# Patient Record
Sex: Female | Born: 2000 | Race: White | Hispanic: No | Smoking: Former smoker
Health system: Southern US, Community
[De-identification: ages and names within clinical notes are randomized; demographics above are authoritative.]

## PROBLEM LIST (undated history)

## (undated) ENCOUNTER — Ambulatory Visit (HOSPITAL_COMMUNITY): Admission: EM | Payer: BC Managed Care – PPO | Source: Home / Self Care

## (undated) DIAGNOSIS — M1990 Unspecified osteoarthritis, unspecified site: Secondary | ICD-10-CM

## (undated) DIAGNOSIS — N83209 Unspecified ovarian cyst, unspecified side: Secondary | ICD-10-CM

## (undated) DIAGNOSIS — N946 Dysmenorrhea, unspecified: Secondary | ICD-10-CM

## (undated) DIAGNOSIS — F32A Depression, unspecified: Secondary | ICD-10-CM

## (undated) DIAGNOSIS — L309 Dermatitis, unspecified: Secondary | ICD-10-CM

## (undated) DIAGNOSIS — N920 Excessive and frequent menstruation with regular cycle: Secondary | ICD-10-CM

## (undated) DIAGNOSIS — F419 Anxiety disorder, unspecified: Secondary | ICD-10-CM

## (undated) HISTORY — DX: Depression, unspecified: F32.A

## (undated) HISTORY — DX: Dysmenorrhea, unspecified: N94.6

## (undated) HISTORY — DX: Unspecified ovarian cyst, unspecified side: N83.209

## (undated) HISTORY — PX: TONSILLECTOMY AND ADENOIDECTOMY: SHX28

## (undated) HISTORY — DX: Dermatitis, unspecified: L30.9

## (undated) HISTORY — DX: Anxiety disorder, unspecified: F41.9

## (undated) HISTORY — DX: Excessive and frequent menstruation with regular cycle: N92.0

## (undated) HISTORY — DX: Unspecified osteoarthritis, unspecified site: M19.90

---
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
## 2019-01-05 NOTE — Telephone Encounter (Signed)
Attempted to contact patient, line is busy.  Will try again later.  Called emergency contact and spoke with [name] (Patients Mother).  She will relate message to call us back.

## 2019-01-05 NOTE — Telephone Encounter (Signed)
Okay thank you, Please let me know if I need to do anything on my end.

## 2019-01-06 ENCOUNTER — Telehealth: Payer: Self-pay | Admitting: Certified Nurse Midwife

## 2019-01-06 NOTE — Telephone Encounter (Signed)
Patient called stating she got a new cell phone and number. The new number is correct in her chart. Patient advised the nurse has 24 hours to return the call. Thanks

## 2019-01-07 NOTE — Telephone Encounter (Signed)
LMTRC

## 2019-01-07 NOTE — Telephone Encounter (Signed)
Patient returned call.  Patient c/o thin clear vaginal discharge x2 months, no pain or odor.  Requesting Rx be sent in, I advised patient she needs an OV for further evaluation, last OV >2 years ago and patient verbalized understanding.  Patient will check work schedule and call back to schedule OV.

## 2019-01-19 ENCOUNTER — Telehealth: Payer: Self-pay | Admitting: Certified Nurse Midwife

## 2019-01-19 NOTE — Telephone Encounter (Signed)
The patient called and stated that she has vaginal discharge, irritation, and itching. Pt thinks she may have a yeast infection or UTI and is hoping to speak with a nurse and have something called into her pharmacy due to the patient being uncomfortable. Pt scheduled an apt for this Thursday but is hoping to have/get relief before that date. Please advise.

## 2019-01-20 NOTE — Telephone Encounter (Signed)
LMTRC

## 2019-01-21 ENCOUNTER — Ambulatory Visit (INDEPENDENT_AMBULATORY_CARE_PROVIDER_SITE_OTHER): Payer: BC Managed Care – PPO | Admitting: Certified Nurse Midwife

## 2019-01-21 ENCOUNTER — Encounter: Payer: Self-pay | Admitting: Certified Nurse Midwife

## 2019-01-21 ENCOUNTER — Other Ambulatory Visit (HOSPITAL_COMMUNITY)
Admission: RE | Admit: 2019-01-21 | Discharge: 2019-01-21 | Disposition: A | Discharge status: Home / Self Care | Payer: BC Managed Care – PPO | Source: Ambulatory Visit | Attending: Certified Nurse Midwife | Admitting: Certified Nurse Midwife

## 2019-01-21 ENCOUNTER — Other Ambulatory Visit: Payer: Self-pay

## 2019-01-21 VITALS — BP 110/79 | HR 91 | Ht 68.0 in | Wt 238.1 lb

## 2019-01-21 DIAGNOSIS — Z975 Presence of (intrauterine) contraceptive device: Secondary | ICD-10-CM | POA: Diagnosis not present

## 2019-01-21 DIAGNOSIS — N898 Other specified noninflammatory disorders of vagina: Secondary | ICD-10-CM | POA: Diagnosis not present

## 2019-01-21 DIAGNOSIS — L732 Hidradenitis suppurativa: Secondary | ICD-10-CM | POA: Diagnosis not present

## 2019-01-21 MED ORDER — CLINDAMYCIN PHOSPHATE 1 % EX LOTN: TOPICAL_LOTION | Freq: Two times a day (BID) | CUTANEOUS | 0 refills | Status: DC

## 2019-01-21 NOTE — Telephone Encounter (Signed)
Patient had OV today.  Chart will be filed.

## 2019-01-21 NOTE — Progress Notes (Signed)
GYN ENCOUNTER NOTE  Subjective:       Maureen Gutierrez is a 18 y.o. G0P0000 female is here for gynecologic evaluation of the following issues:  1. Vaginal discharge-thick yellowish/whiteish  2. Vaginal irritation 3. Vaginal itching  Reports symptoms for the last month. Wearing pad daily. Minimal relief with home treatment measures.   Denies difficulty breathing or respiratory distress, chest pain, abdominal pain, excessive vaginal bleeding, dysuria, and leg pain or swelling.    Gynecologic History  No LMP recorded. (Menstrual status: IUD).  Contraception: IUD, Mirena placed 09/2016  Last Pap: N/A  Obstetric History  OB History  Gravida Para Term Preterm AB Living  0 0 0 0 0 0  SAB TAB Ectopic Multiple Live Births  0 0 0 0 0    Past Medical History:  Diagnosis Date  . Eczema   . Heavy periods   . Ovarian cyst    left - Dani Gobble (Midwife)  . Painful menstrual periods     Past Surgical History:  Procedure Laterality Date  . TONSILLECTOMY AND ADENOIDECTOMY      Current Outpatient Medications on File Prior to Visit  Medication Sig Dispense Refill  . levonorgestrel (MIRENA) 20 MCG/24HR IUD 1 each by Intrauterine route once.     No current facility-administered medications on file prior to visit.     No Known Allergies  Social History   Socioeconomic History  . Marital status: Single    Spouse name: Not on file  . Number of children: 0  . Years of education: Not on file  . Highest education level: High school graduate  Occupational History  . Occupation: Programme researcher, broadcasting/film/video    Comment: [employer]  . Occupation: Scientist, water quality    Comment: [employer]  . Occupation: Hosts    Comment: [employer]  . Financial resource strain: Not hard at all  . Food insecurity    Worry: Never true    Inability: Never true  . Transportation needs    Medical: No    Non-medical: No  Tobacco Use  . Smoking status: Current Every Day Smoker    Packs/day: 0.50     Types: Cigarettes  . Smokeless tobacco: Never Used  Substance and Sexual Activity  . Alcohol use: Yes    Comment: rare  . Drug use: No  . Sexual activity: Yes    Partners: Male    Birth control/protection: I.U.D.  Lifestyle  . Physical activity    Days per week: 0 days    Minutes per session: 0 min  . Stress: Only a little  Relationships  . Social connections    Talks on phone: More than three times a week    Gets together: More than three times a week    Attends religious service: More than 4 times per year    Active member of club or organization: Yes    Attends meetings of clubs or organizations: More than 4 times per year    Relationship status: Never married  . Intimate partner violence    Fear of current or ex partner: No    Emotionally abused: No    Physically abused: No    Forced sexual activity: No  Other Topics Concern  . Not on file  Social History Narrative   Works 6 days a week at [employer] to [employer] and going to [employer] to get a 2 year college degree    Family History  Problem Relation Age of Onset  .  Rheum arthritis Maternal Grandmother   . Obesity Mother   . Hypertension Father   . ADD / ADHD Brother   . Breast cancer Neg Hx   . Ovarian cancer Neg Hx   . Colon cancer Neg Hx     The following portions of the patient's history were reviewed and updated as appropriate: allergies, current medications, past family history, past medical history, past social history, past surgical history and problem list.  Review of Systems  ROS negative except as noted above. Information obtained from patient.   Objective:   BP 110/79   Pulse 91   Ht 5\' 8"  (1.727 m)   Wt 238 lb 1.6 oz (108 kg)   BMI 36.20 kg/m    CONSTITUTIONAL: Well-developed, well-nourished female in no acute distress.   ABDOMEN: Soft, non distended; Non tender.  No Organomegaly.  PELVIC:  External Genitalia: Normal except for presence of hidradenitis suppurativa  Vagina: Swab  collected   MUSCULOSKELETAL: Normal range of motion. No tenderness.  No cyanosis, clubbing, or edema.  Assessment:   1. Vaginal discharge  - Cervicovaginal ancillary only  2. Vaginal irritation  - Cervicovaginal ancillary only  3. Vaginal itching  - Cervicovaginal ancillary only  4. IUD (intrauterine device) in place  - Cervicovaginal ancillary only   Plan:   Vaginal swab collected, will contact patient with results. Unable to tolerate speculum exam today.   Discussed home vaginal health techniques, see AVS  Rx: Cleocin lotion, see orders.   Reviewed red flag symptoms and when to call.   RTC x 10 months for ANNUAL EXAM or sooner if needed.    , CNM [facility], [facility] 01/21/19 10:02 AM

## 2019-01-21 NOTE — Patient Instructions (Addendum)
Levonorgestrel intrauterine device (IUD) What is this medicine? LEVONORGESTREL IUD (LEE voe nor jes trel) is a contraceptive (birth control) device. The device is placed inside the uterus by a healthcare professional. It is used to prevent pregnancy. This device can also be used to treat heavy bleeding that occurs during your period. This medicine may be used for other purposes; ask your health care provider or pharmacist if you have questions. COMMON BRAND NAME(S): Kyleena, LILETTA, Mirena, Skyla What should I tell my health care provider before I take this medicine? They need to know if you have any of these conditions:  abnormal Pap smear  cancer of the breast, uterus, or cervix  diabetes  endometritis  genital or pelvic infection now or in the past  have more than one sexual partner or your partner has more than one partner  heart disease  history of an ectopic or tubal pregnancy  immune system problems  IUD in place  liver disease or tumor  problems with blood clots or take blood-thinners  seizures  use intravenous drugs  uterus of unusual shape  vaginal bleeding that has not been explained  an unusual or allergic reaction to levonorgestrel, other hormones, silicone, or polyethylene, medicines, foods, dyes, or preservatives  pregnant or trying to get pregnant  breast-feeding How should I use this medicine? This device is placed inside the uterus by a health care professional. Talk to your pediatrician regarding the use of this medicine in children. Special care may be needed. Overdosage: If you think you have taken too much of this medicine contact a poison control center or emergency room at once. NOTE: This medicine is only for you. Do not share this medicine with others. What if I miss a dose? This does not apply. Depending on the brand of device you have inserted, the device will need to be replaced every 3 to 6 years if you wish to continue using this type  of birth control. What may interact with this medicine? Do not take this medicine with any of the following medications:  amprenavir  bosentan  fosamprenavir This medicine may also interact with the following medications:  aprepitant  armodafinil  barbiturate medicines for inducing sleep or treating seizures  bexarotene  boceprevir  griseofulvin  medicines to treat seizures like carbamazepine, ethotoin, felbamate, oxcarbazepine, phenytoin, topiramate  modafinil  pioglitazone  rifabutin  rifampin  rifapentine  some medicines to treat HIV infection like atazanavir, efavirenz, indinavir, lopinavir, nelfinavir, tipranavir, ritonavir  St. John's wort  warfarin This list may not describe all possible interactions. Give your health care provider a list of all the medicines, herbs, non-prescription drugs, or dietary supplements you use. Also tell them if you smoke, drink alcohol, or use illegal drugs. Some items may interact with your medicine. What should I watch for while using this medicine? Visit your doctor or health care professional for regular check ups. See your doctor if you or your partner has sexual contact with others, becomes HIV positive, or gets a sexual transmitted disease. This product does not protect you against HIV infection (AIDS) or other sexually transmitted diseases. You can check the placement of the IUD yourself by reaching up to the top of your vagina with clean fingers to feel the threads. Do not pull on the threads. It is a good habit to check placement after each menstrual period. Call your doctor right away if you feel more of the IUD than just the threads or if you cannot feel the threads at   all. The IUD may come out by itself. You may become pregnant if the device comes out. If you notice that the IUD has come out use a backup birth control method like condoms and call your health care provider. Using tampons will not change the position of the  IUD and are okay to use during your period. This IUD can be safely scanned with magnetic resonance imaging (MRI) only under specific conditions. Before you have an MRI, tell your healthcare provider that you have an IUD in place, and which type of IUD you have in place. What side effects may I notice from receiving this medicine? Side effects that you should report to your doctor or health care professional as soon as possible:  allergic reactions like skin rash, itching or hives, swelling of the face, lips, or tongue  fever, flu-like symptoms  genital sores  high blood pressure  no menstrual period for 6 weeks during use  pain, swelling, warmth in the leg  pelvic pain or tenderness  severe or sudden headache  signs of pregnancy  stomach cramping  sudden shortness of breath  trouble with balance, talking, or walking  unusual vaginal bleeding, discharge  yellowing of the eyes or skin Side effects that usually do not require medical attention (report to your doctor or health care professional if they continue or are bothersome):  acne  breast pain  change in sex drive or performance  changes in weight  cramping, dizziness, or faintness while the device is being inserted  headache  irregular menstrual bleeding within first 3 to 6 months of use  nausea This list may not describe all possible side effects. Call your doctor for medical advice about side effects. You may report side effects to FDA at 1-800-FDA-1088. Where should I keep my medicine? This does not apply. NOTE: This sheet is a summary. It may not cover all possible information. If you have questions about this medicine, talk to your doctor, pharmacist, or health care provider.  2020 Elsevier/Gold Standard (2018-01-27 13:22:01) Preventive Care 36-60 Years Old, Female Preventive care refers to lifestyle choices and visits with your health care provider that can promote health and wellness. At this stage in  your life, you may start seeing a primary care physician instead of a pediatrician. Your health care is now your responsibility. Preventive care for young adults includes:  A yearly physical exam. This is also called an annual wellness visit.  Regular dental and eye exams.  Immunizations.  Screening for certain conditions.  Healthy lifestyle choices, such as diet and exercise. What can I expect for my preventive care visit? Physical exam Your health care provider may check:  Height and weight. These may be used to calculate body mass index (BMI), which is a measurement that tells if you are at a healthy weight.  Heart rate and blood pressure.  Body temperature. Counseling Your health care provider may ask you questions about:  Past medical problems and family medical history.  Alcohol, tobacco, and drug use.  Home and relationship well-being.  Access to firearms.  Emotional well-being.  Diet, exercise, and sleep habits.  Sexual activity and sexual health.  Method of birth control.  Menstrual cycle.  Pregnancy history. What immunizations do I need?  Influenza (flu) vaccine  This is recommended every year. Tetanus, diphtheria, and pertussis (Tdap) vaccine  You may need a Td booster every 10 years. Varicella (chickenpox) vaccine  You may need this vaccine if you have not already been vaccinated. Human  papillomavirus (HPV) vaccine  If recommended by your health care provider, you may need three doses over 6 months. Measles, mumps, and rubella (MMR) vaccine  You may need at least one dose of MMR. You may also need a second dose. Meningococcal conjugate (MenACWY) vaccine  One dose is recommended if you are 20-53 years old and a Market researcher living in a residence hall, or if you have one of several medical conditions. You may also need additional booster doses. Pneumococcal conjugate (PCV13) vaccine  You may need this if you have certain  conditions and were not previously vaccinated. Pneumococcal polysaccharide (PPSV23) vaccine  You may need one or two doses if you smoke cigarettes or if you have certain conditions. Hepatitis A vaccine  You may need this if you have certain conditions or if you travel or work in places where you may be exposed to hepatitis A. Hepatitis B vaccine  You may need this if you have certain conditions or if you travel or work in places where you may be exposed to hepatitis B. Haemophilus influenzae type b (Hib) vaccine  You may need this if you have certain risk factors. You may receive vaccines as individual doses or as more than one vaccine together in one shot (combination vaccines). Talk with your health care provider about the risks and benefits of combination vaccines. What tests do I need? Blood tests  Lipid and cholesterol levels. These may be checked every 5 years starting at age 88.  Hepatitis C test.  Hepatitis B test. Screening  Pelvic exam and Pap test. This may be done every 3 years starting at age 68.  Sexually transmitted disease (STD) testing, if you are at risk.  BRCA-related cancer screening. This may be done if you have a family history of breast, ovarian, tubal, or peritoneal cancers. Other tests  Tuberculosis skin test.  Vision and hearing tests.  Skin exam.  Breast exam. Follow these instructions at home: Eating and drinking   Eat a diet that includes fresh fruits and vegetables, whole grains, lean protein, and low-fat dairy products.  Drink enough fluid to keep your urine pale yellow.  Do not drink alcohol if: ? Your health care provider tells you not to drink. ? You are pregnant, may be pregnant, or are planning to become pregnant. ? You are under the legal drinking age. In the [place] the legal drinking age is 24.  If you drink alcohol: ? Limit how much you have to 0-1 drink a day. ? Be aware of how much alcohol is in your drink. In the [place] one  drink equals one 12 oz bottle of beer (355 mL), one 5 oz glass of wine (148 mL), or one 1 oz glass of hard liquor (44 mL). Lifestyle  Take daily care of your teeth and gums.  Stay active. Exercise at least 30 minutes 5 or more days of the week.  Do not use any products that contain nicotine or tobacco, such as cigarettes, e-cigarettes, and chewing tobacco. If you need help quitting, ask your health care provider.  Do not use drugs.  If you are sexually active, practice safe sex. Use a condom or other form of birth control (contraception) in order to prevent pregnancy and STIs (sexually transmitted infections). If you plan to become pregnant, see your health care provider for a pre-conception visit.  Find healthy ways to cope with stress, such as: ? Meditation, yoga, or listening to music. ? Journaling. ? Talking to a trusted  person. ? Spending time with friends and family. Safety  Always wear your seat belt while driving or riding in a vehicle.  Do not drive if you have been drinking alcohol. Do not ride with someone who has been drinking.  Do not drive when you are tired or distracted. Do not text while driving.  Wear a helmet and other protective equipment during sports activities.  If you have firearms in your house, make sure you follow all gun safety procedures.  Seek help if you have been bullied, physically abused, or sexually abused.  Use the Internet responsibly to avoid dangers such as online bullying and online sex predators. What's next?  Go to your health care provider once a year for a well check visit.  Ask your health care provider how often you should have your eyes and teeth checked.  Stay up to date on all vaccines. This information is not intended to replace advice given to you by your health care provider. Make sure you discuss any questions you have with your health care provider. Document Released: 08/03/2015 Document Revised: 03/12/2018 Document  Reviewed: 03/12/2018 Elsevier Patient Education  [place].  Vaginitis Vaginitis is a condition in which the vaginal tissue swells and becomes red (inflamed). This condition is most often caused by a change in the normal balance of bacteria and yeast that live in the vagina. This change causes an overgrowth of certain bacteria or yeast, which causes the inflammation. There are different types of vaginitis, but the most common types are:  Bacterial vaginosis.  Yeast infection (candidiasis).  Trichomoniasis vaginitis. This is a sexually transmitted disease (STD).  Viral vaginitis.  Atrophic vaginitis.  Allergic vaginitis. What are the causes? The cause of this condition depends on the type of vaginitis. It can be caused by:  Bacteria (bacterial vaginosis).  Yeast, which is a fungus (yeast infection).  A parasite (trichomoniasis vaginitis).  A virus (viral vaginitis).  Low hormone levels (atrophic vaginitis). Low hormone levels can occur during pregnancy, breastfeeding, or after menopause.  Irritants, such as bubble baths, scented tampons, and feminine sprays (allergic vaginitis). Other factors can change the normal balance of the yeast and bacteria that live in the vagina. These include:  Antibiotic medicines.  Poor hygiene.  Diaphragms, vaginal sponges, spermicides, birth control pills, and intrauterine devices (IUD).  Sex.  Infection.  Uncontrolled diabetes.  A weakened defense (immune) system. What increases the risk? This condition is more likely to develop in women who:  Smoke.  Use vaginal douches, scented tampons, or scented sanitary pads.  Wear tight-fitting pants.  Wear thong underwear.  Use oral birth control pills or an IUD.  Have sex without a condom.  Have multiple sex partners.  Have an STD.  Frequently use the spermicide nonoxynol-9.  Eat lots of foods high in sugar.  Have uncontrolled diabetes.  Have low estrogen  levels.  Have a weakened immune system from an immune disorder or medical treatment.  Are pregnant or breastfeeding. What are the signs or symptoms? Symptoms vary depending on the cause of the vaginitis. Common symptoms include:  Abnormal vaginal discharge. ? The discharge is white, gray, or yellow with bacterial vaginosis. ? The discharge is thick, white, and cheesy with a yeast infection. ? The discharge is frothy and yellow or greenish with trichomoniasis.  A bad vaginal smell. The smell is fishy with bacterial vaginosis.  Vaginal itching, pain, or swelling.  Sex that is painful.  Pain or burning when urinating. Sometimes there are no  symptoms. How is this diagnosed? This condition is diagnosed based on your symptoms and medical history. A physical exam, including a pelvic exam, will also be done. You may also have other tests, including:  Tests to determine the pH level (acidity or alkalinity) of your vagina.  A whiff test, to assess the odor that results when a sample of your vaginal discharge is mixed with a potassium hydroxide solution.  Tests of vaginal fluid. A sample will be examined under a microscope. How is this treated? Treatment varies depending on the type of vaginitis you have. Your treatment may include:  Antibiotic creams or pills to treat bacterial vaginosis and trichomoniasis.  Antifungal medicines, such as vaginal creams or suppositories, to treat a yeast infection.  Medicine to ease discomfort if you have viral vaginitis. Your sexual partner should also be treated.  Estrogen delivered in a cream, pill, suppository, or vaginal ring to treat atrophic vaginitis. If vaginal dryness occurs, lubricants and moisturizing creams may help. You may need to avoid scented soaps, sprays, or douches.  Stopping use of a product that is causing allergic vaginitis. Then using a vaginal cream to treat the symptoms. Follow these instructions at home: Lifestyle  Keep  your genital area clean and dry. Avoid soap, and only rinse the area with water.  Do not douche or use tampons until your health care provider says it is okay to do so. Use sanitary pads, if needed.  Do not have sex until your health care provider approves. When you can return to sex, practice safe sex and use condoms.  Wipe from front to back. This avoids the spread of bacteria from the rectum to the vagina. General instructions  Take over-the-counter and prescription medicines only as told by your health care provider.  If you were prescribed an antibiotic medicine, take or use it as told by your health care provider. Do not stop taking or using the antibiotic even if you start to feel better.  Keep all follow-up visits as told by your health care provider. This is important. How is this prevented?  Use mild, non-scented products. Do not use things that can irritate the vagina, such as fabric softeners. Avoid the following products if they are scented: ? Feminine sprays. ? Detergents. ? Tampons. ? Feminine hygiene products. ? Soaps or bubble baths.  Let air reach your genital area. ? Wear cotton underwear to reduce moisture buildup. ? Avoid wearing underwear while you sleep. ? Avoid wearing tight pants and underwear or nylons without a cotton panel. ? Avoid wearing thong underwear.  Take off any wet clothing, such as bathing suits, as soon as possible.  Practice safe sex and use condoms. Contact a health care provider if:  You have abdominal pain.  You have a fever.  You have symptoms that last for more than 2-3 days. Get help right away if:  You have a fever and your symptoms suddenly get worse. Summary  Vaginitis is a condition in which the vaginal tissue becomes inflamed.This condition is most often caused by a change in the normal balance of bacteria and yeast that live in the vagina.  Treatment varies depending on the type of vaginitis you have.  Do not douche,  use tampons , or have sex until your health care provider approves. When you can return to sex, practice safe sex and use condoms. This information is not intended to replace advice given to you by your health care provider. Make sure you discuss any questions you  have with your health care provider. Document Released: 01/13/2007 Document Revised: 02/28/2017 Document Reviewed: 04/23/2016 Elsevier Patient Education  [place].  Clindamycin Phosphate Topical Lotion What is this medicine? CLINDAMYCIN ([place] sin) is a lincosamide antibiotic. It is used on the skin to stop the growth of certain bacteria that cause acne. This medicine may be used for other purposes; ask your health care provider or pharmacist if you have questions. COMMON BRAND NAME(S): Cleocin T, ClindaMax What should I tell my health care provider before I take this medicine? They need to know if you have any of these conditions:  diarrhea  inflammatory bowel disease  kidney or liver disease  stomach problems like colitis  an unusual or allergic reaction to clindamycin, lincomycin, other medicines, foods, dyes or preservatives  pregnant or trying to get pregnant  breast-feeding How should I use this medicine? This medicine is for external use only. Wash hands before and after use. Wash affected area and gently pat dry. Shake well before use. Apply a thin layer of this medicine to the affected area as often as prescribed by your doctor or health care professional. Do not use this medicine near the eyes, nose, or mouth. If you do get any in your eyes rinse out with plenty of cool tap water. Take all of your medicine as directed even if you think your are better. Do not skip doses or stop your medicine early. Talk to your pediatrician regarding the use of this medicine in children. Special care may be needed. While this medicine may be prescribed for children as young as 12 years for selected conditions,  precautions do apply. Overdosage: If you think you have taken too much of this medicine contact a poison control center or emergency room at once. NOTE: This medicine is only for you. Do not share this medicine with others. What if I miss a dose? If you miss a dose, use it as soon as you can. If it is almost time for your next dose, use only that dose. Do not use double or extra doses. What may interact with this medicine?  medicated cosmetics, including cover up preparations  other acne products including benzoyl peroxide, salicylic acid, or tretinoin  skin care products that have a high alcohol content (some shaving creams, lotions, or after-shave lotion)  some skin cleansers or medicated soaps This list may not describe all possible interactions. Give your health care provider a list of all the medicines, herbs, non-prescription drugs, or dietary supplements you use. Also tell them if you smoke, drink alcohol, or use illegal drugs. Some items may interact with your medicine. What should I watch for while using this medicine? Your acne should start to get better within about 6 weeks. Complete improvement may take longer. Tell your doctor or health care professional if you do not see any improvement. Your skin may get very dry and scale or peel. Let your doctor or health care professional know if this happens. Do not use any soothing cream or ointment without advice. What side effects may I notice from receiving this medicine? Side effects that you should report to your doctor or health care professional as soon as possible:  allergic reactions like skin rash, itching or hives, swelling of the face, lips, or tongue  diarrhea that is watery or severe  pain on swallowing  stomach pain or cramps  unusual bleeding or bruising Side effects that usually do not require medical attention (report to your doctor or  health care professional if they continue or are bothersome):  dry  skin  nausea, vomiting This list may not describe all possible side effects. Call your doctor for medical advice about side effects. You may report side effects to FDA at 1-800-FDA-1088. Where should I keep my medicine? Keep out of the reach of children. Store at controlled room temperature between 20 and 25 degrees C (68 and 77 degrees F). Do not freeze. This medicine is flammable. Avoid exposure to heat, fire, flame, and smoking. Throw away any unused medicine after the expiration date. NOTE: This sheet is a summary. It may not cover all possible information. If you have questions about this medicine, talk to your doctor, pharmacist, or health care provider.  2020 Elsevier/Gold Standard (2012-10-22 16:16:30)    Hidradenitis Suppurativa Hidradenitis suppurativa is a long-term (chronic) skin disease. It is similar to a severe form of acne, but it affects areas of the body where acne would be unusual, especially areas of the body where skin rubs against skin and becomes moist. These include:  Underarms.  Groin.  Genital area.  Buttocks.  Upper thighs.  Breasts. Hidradenitis suppurativa may start out as small lumps or pimples caused by blocked sweat glands or hair follicles. Pimples may develop into deep sores that break open (rupture) and drain pus. Over time, affected areas of skin may thicken and become scarred. This condition is rare and does not spread from person to person (non-contagious). What are the causes? The exact cause of this condition is not known. It may be related to:  Female and female hormones.  An overactive disease-fighting system (immune system). The immune system may over-react to blocked hair follicles or sweat glands and cause swelling and pus-filled sores. What increases the risk? You are more likely to develop this condition if you:  Are female.  Are 7-79 years old.  Have a family history of hidradenitis suppurativa.  Have a personal history of  acne.  Are overweight.  Smoke.  Take the medicine lithium. What are the signs or symptoms? The first symptoms are usually painful bumps in the skin, similar to pimples. The condition may get worse over time (progress), or it may only cause mild symptoms. If the disease progresses, symptoms may include:  Skin bumps getting bigger and growing deeper into the skin.  Bumps rupturing and draining pus.  Itchy, infected skin.  Skin getting thicker and scarred.  Tunnels under the skin (fistulas) where pus drains from a bump.  Pain during daily activities, such as pain during walking if your groin area is affected.  Emotional problems, such as stress or depression. This condition may affect your appearance and your ability or willingness to wear certain clothes or do certain activities. How is this diagnosed? This condition is diagnosed by a health care provider who specializes in skin diseases (dermatologist). You may be diagnosed based on:  Your symptoms and medical history.  A physical exam.  Testing a pus sample for infection.  Blood tests. How is this treated? Your treatment will depend on how severe your symptoms are. The same treatment will not work for everybody with this condition. You may need to try several treatments to find what works best for you. Treatment may include:  Cleaning and bandaging (dressing) your wounds as needed.  Lifestyle changes, such as new skin care routines.  Taking medicines, such as: ? Antibiotics. ? Acne medicines. ? Medicines to reduce the activity of the immune system. ? A diabetes medicine (metformin). ? Birth  control pills, for women. ? Steroids to reduce swelling and pain.  Working with a mental health care provider, if you experience emotional distress due to this condition. If you have severe symptoms that do not get better with medicine, you may need surgery. Surgery may involve:  Using a laser to clear the skin and remove hair  follicles.  Opening and draining deep sores.  Removing the areas of skin that are diseased and scarred. Follow these instructions at home: Medicines   Take over-the-counter and prescription medicines only as told by your health care provider.  If you were prescribed an antibiotic medicine, take it as told by your health care provider. Do not stop taking the antibiotic even if your condition improves. Skin care  If you have open wounds, cover them with a clean dressing as told by your health care provider. Keep wounds clean by washing them gently with soap and water when you bathe.  Do not shave the areas where you get hidradenitis suppurativa.  Do not wear deodorant.  Wear loose-fitting clothes.  Try to avoid getting overheated or sweaty. If you get sweaty or wet, change into clean, dry clothes as soon as you can.  To help relieve pain and itchiness, cover sore areas with a warm, clean washcloth (warm compress) for 5-10 minutes as often as needed.  If told by your health care provider, take a bleach bath twice a week: ? Fill your bathtub halfway with water. ? Pour in  cup of unscented household bleach. ? Soak in the tub for 5-10 minutes. ? Only soak from the neck down. Avoid water on your face and hair. ? Shower to rinse off the bleach from your skin. General instructions  Learn as much as you can about your disease so that you have an active role in your treatment. Work closely with your health care provider to find treatments that work for you.  If you are overweight, work with your health care provider to lose weight as recommended.  Do not use any products that contain nicotine or tobacco, such as cigarettes and e-cigarettes. If you need help quitting, ask your health care provider.  If you struggle with living with this condition, talk with your health care provider or work with a mental health care provider as recommended.  Keep all follow-up visits as told by your  health care provider. This is important. Where to find more information  Hidradenitis [place].: [email or web address]://www.hs-foundation.org/ Contact a health care provider if you have:  A flare-up of hidradenitis suppurativa.  A fever or chills.  Trouble controlling your symptoms at home.  Trouble doing your daily activities because of your symptoms.  Trouble dealing with emotional problems related to your condition. Summary  Hidradenitis suppurativa is a long-term (chronic) skin disease. It is similar to a severe form of acne, but it affects areas of the body where acne would be unusual.  The first symptoms are usually painful bumps in the skin, similar to pimples. The condition may get worse over time (progress), or it may only cause mild symptoms.  If you have open wounds, cover them with a clean dressing as told by your health care provider. Keep wounds clean by washing them gently with soap and water when you bathe.  Besides skin care, treatment may include medicines, laser treatment, and surgery. This information is not intended to replace advice given to you by your health care provider. Make sure you discuss any questions you have with your health  care provider. Document Released: 10/31/2003 Document Revised: 03/26/2017 Document Reviewed: 03/26/2017 Elsevier Patient Education  2020 [employer].

## 2019-01-21 NOTE — Progress Notes (Signed)
Patient c/o thick yellowish/whiteish vaginal discharge, vaginal irritation and vaginal itching x1 month.  Patient having to wear pad.

## 2019-01-26 DIAGNOSIS — Z20828 Contact with and (suspected) exposure to other viral communicable diseases: Secondary | ICD-10-CM | POA: Diagnosis not present

## 2019-01-27 ENCOUNTER — Ambulatory Visit
Admission: RE | Admit: 2019-01-27 | Discharge: 2019-01-27 | Disposition: A | Discharge status: Home / Self Care | Payer: BC Managed Care – PPO | Source: Ambulatory Visit | Attending: Family Medicine | Admitting: Family Medicine

## 2019-01-27 ENCOUNTER — Other Ambulatory Visit: Payer: Self-pay | Admitting: Family Medicine

## 2019-01-27 ENCOUNTER — Telehealth: Payer: BC Managed Care – PPO | Admitting: Family Medicine

## 2019-01-27 ENCOUNTER — Other Ambulatory Visit: Payer: Self-pay

## 2019-01-27 DIAGNOSIS — R05 Cough: Secondary | ICD-10-CM | POA: Diagnosis not present

## 2019-01-27 DIAGNOSIS — R059 Cough, unspecified: Secondary | ICD-10-CM

## 2019-01-28 ENCOUNTER — Other Ambulatory Visit (INDEPENDENT_AMBULATORY_CARE_PROVIDER_SITE_OTHER): Payer: BC Managed Care – PPO | Admitting: Certified Nurse Midwife

## 2019-01-28 DIAGNOSIS — A599 Trichomoniasis, unspecified: Secondary | ICD-10-CM

## 2019-01-28 DIAGNOSIS — A749 Chlamydial infection, unspecified: Secondary | ICD-10-CM

## 2019-01-28 DIAGNOSIS — A549 Gonococcal infection, unspecified: Secondary | ICD-10-CM

## 2019-01-28 LAB — CERVICOVAGINAL ANCILLARY ONLY
Bacterial Vaginitis (gardnerella): NEGATIVE
Candida Glabrata: NEGATIVE
Candida Vaginitis: NEGATIVE
Chlamydia: POSITIVE — AB
Comment: NEGATIVE
Comment: NEGATIVE
Comment: NEGATIVE
Comment: NEGATIVE
Comment: NEGATIVE
Comment: NORMAL
Neisseria Gonorrhea: POSITIVE — AB
Trichomonas: POSITIVE — AB

## 2019-01-28 MED ORDER — AZITHROMYCIN 500 MG PO TABS: 1000.0000 mg | ORAL_TABLET | Freq: Once | ORAL | 0 refills | Status: AC

## 2019-01-28 MED ORDER — METRONIDAZOLE 500 MG PO TABS: ORAL_TABLET | ORAL | 0 refills | Status: DC

## 2019-01-28 NOTE — Progress Notes (Signed)
Rx: Azithromycin and Flagyl, see orders.    Diona Fanti, CNM [facility] [place] 01/28/19 12:09 PM

## 2019-01-28 NOTE — Progress Notes (Signed)
Please contact pt, vaginal swab positive for GC/CH and Trich. Rx Azithromycin and Flagyl sent. Needs to come to office for Rocephin IM. Schedule TOC in one (1) month. Contact [facility]. Thanks, JML

## 2019-01-29 ENCOUNTER — Ambulatory Visit (INDEPENDENT_AMBULATORY_CARE_PROVIDER_SITE_OTHER): Payer: BC Managed Care – PPO | Admitting: Certified Nurse Midwife

## 2019-01-29 ENCOUNTER — Other Ambulatory Visit: Payer: Self-pay

## 2019-01-29 VITALS — BP 126/86 | HR 112 | Ht 68.0 in | Wt 236.8 lb

## 2019-01-29 DIAGNOSIS — A549 Gonococcal infection, unspecified: Secondary | ICD-10-CM | POA: Diagnosis not present

## 2019-01-29 DIAGNOSIS — A749 Chlamydial infection, unspecified: Secondary | ICD-10-CM

## 2019-01-29 MED ORDER — CEFTRIAXONE SODIUM 250 MG IJ SOLR
250.0000 mg | Freq: Once | INTRAMUSCULAR | Status: AC
  Administered 2019-01-29: 08:00:00 250 mg via INTRAMUSCULAR

## 2019-01-29 NOTE — Patient Instructions (Signed)

## 2019-01-29 NOTE — Progress Notes (Signed)
Patient comes in today for Ceftriaxone 250 mg injection in upper outer left gluten. Patient tolerated well.  She has already taken the azithromycin.

## 2019-02-12 ENCOUNTER — Telehealth: Payer: Self-pay

## 2019-02-12 NOTE — Telephone Encounter (Signed)
Pt would like to speak to someone this morning. Pt states it usually takes a long time for someone to call her back. Pt needs advice.  Pt states took all meds and had the shot and feels fine. Pt also notes she does have IUD in place. Pt concerned of slight blood this morning when wiping in bathroom. No burning or itching,. Call to advise if normal or needs appt.

## 2019-02-12 NOTE — Telephone Encounter (Signed)
Attempted to contact patient, no answer.  LMTRC. 

## 2019-02-15 NOTE — Telephone Encounter (Signed)
Called and spoke with patient.  Patient c/o occasional vaginal spotting that started 3 days ago, not enough to wear a pad.  Offered appointment and patient prefers to just monitor for now.  Patient denies any other complaints, no fever, no pain.  Advised patient if symptoms persist or get worse to let us know and patient verbalized understating.

## 2019-02-15 NOTE — Telephone Encounter (Signed)
Attempted to contact patient, no answer.  LMTRC. 

## 2019-02-15 NOTE — Telephone Encounter (Signed)
Pt lvm stating returning call to [place].

## 2019-02-24 ENCOUNTER — Telehealth: Payer: Self-pay

## 2019-02-24 NOTE — Telephone Encounter (Signed)
Called and spoke with patient.  Patient c/o vaginal spotting and thin vaginal discharge x1 week.  Patient having to wear pad and vaginal area is very sensitive.  Patient states partner was never treated and patient had unprotected intercourse "with him and someone else".  Advised patient to go to urgent care or come in 12/1 for [facility] appointment.  Patient verbalized understanding.  Patient states she will probably go to urgent care.

## 2019-02-24 NOTE — Telephone Encounter (Signed)
Pt called requesting to speak with providers assistant. Pt states she has concerns and questions.

## 2019-03-02 ENCOUNTER — Encounter: Payer: BC Managed Care – PPO | Admitting: Certified Nurse Midwife

## 2019-04-07 DIAGNOSIS — Z20828 Contact with and (suspected) exposure to other viral communicable diseases: Secondary | ICD-10-CM | POA: Diagnosis not present

## 2019-04-26 ENCOUNTER — Telehealth: Payer: Self-pay

## 2019-04-26 NOTE — Telephone Encounter (Signed)
Called and spoke with patient.  Patient c/o constant lower back and "slight pelvic pain" x2 days, also having bright red vaginal bleeding that started yesterday.  Taking Ibuprofen or Tylenol with no relief, using heating pad.  Patient requesting an appointment today, advised patient no available appointments today but can schedule an OV in the near future.  Patient states "I already have an appointment today at 4:15 pm at an Urgent Care on [facility]".  Patient will call back if she needs appointment scheduled for further evaluation.

## 2019-04-26 NOTE — Telephone Encounter (Signed)
Pt is experiencing pain pain and bleeding. Feels that her cyst are growing. Has IUD. Would like to a call from nurse since Marcelino Duster is out.

## 2019-04-27 ENCOUNTER — Ambulatory Visit: Payer: BC Managed Care – PPO | Admitting: Family Medicine

## 2019-06-30 ENCOUNTER — Encounter: Payer: BC Managed Care – PPO | Admitting: Certified Nurse Midwife

## 2019-07-26 ENCOUNTER — Ambulatory Visit (INDEPENDENT_AMBULATORY_CARE_PROVIDER_SITE_OTHER): Payer: BC Managed Care – PPO | Admitting: Internal Medicine

## 2019-07-26 ENCOUNTER — Other Ambulatory Visit: Payer: Self-pay

## 2019-07-26 ENCOUNTER — Encounter: Payer: Self-pay | Admitting: Internal Medicine

## 2019-07-26 VITALS — Ht 68.0 in | Wt 236.0 lb

## 2019-07-26 DIAGNOSIS — F17200 Nicotine dependence, unspecified, uncomplicated: Secondary | ICD-10-CM | POA: Insufficient documentation

## 2019-07-26 DIAGNOSIS — J40 Bronchitis, not specified as acute or chronic: Secondary | ICD-10-CM

## 2019-07-26 MED ORDER — AZITHROMYCIN 250 MG PO TABS: ORAL_TABLET | ORAL | 0 refills | Status: DC

## 2019-07-26 NOTE — Progress Notes (Signed)
Per Dr. Dorris Fetch a letter was written for the patient to be out of work for 3-5 days pending symptoms improve. Patient should follow up in office if symptoms continue or worsen.

## 2019-07-26 NOTE — Progress Notes (Signed)
Name: Maureen Gutierrez   MRN: [id number]    DOB: 29-Dec-2000   Date:07/26/2019       Progress Note  Subjective  Chief Complaint  Chief Complaint  Patient presents with  . Cough    Wheezing, one week ago started to have a "tickle in the back of the throat"  . Chest congestion    tested negative for covid, painful to cough, chest hurts  . Nasal Congestion    took 5 days of prednisone and a multi symptom cold medication    I connected with  Ruta Oliva  on 07/26/19 at 10:00 AM EDT by a video enabled telemedicine application and verified that I am speaking with the correct person using two identifiers.  I discussed the limitations of evaluation and management by telemedicine and the availability of in person appointments. The patient expressed understanding and agreed to proceed. Staff also discussed with the patient that there may be a patient responsible charge related to this service. Patient Location: Home Provider Location: [facility] Additional Individuals present: none  HPI Patient presents today with concerns for bronchitis. She noted a "tickle in the back of her throat "starting about a week and half ago, next day had increased nasal congestion/postnasal drip, also increased cough and wheezing.  She notes it is now more painful to cough and her chest hurts, and has been over the past several days. She has taken 5 days of prednisone - 10 mg, weaned over 6 days, and a multi symptom cold medication, benadryl at night recommended after she saw her mother's doctor.  Today is her last day of the medicine She noted she did have a Covid test which was negative at [facility] doctor and was nasal swab rapid test.  The doctor noted it was likely a viral type of infection not Covid. The cough has continued, + production - yellow mucus , no blood No marked SOB, occas  Low grade fever has been noted, feeling feverish, sweats yesterday no loss of smell, loss of taste questioned No N/V min muscle aches  at best.  No marked body aches + fatigue No marked loose stools/diarrhea CP increased with cough, no confusion, no passing out episodes She works at [employer], and has not gone to work in the last 2-3 days due to her symptoms.  She has been going to work prior. Of note, her grandparents both had Covid in January, and had similar type symptoms with their initial rapid Covid test negative, and then her follow-up testing with the test it took several days to come back was positive.  She notes she may have had the test too early and it may have been a false negative.  Comorbid conditions reviewed No asthma  No h/o DM, heart disease, CKD, positive obesity  Tob -current every day smoker, not even a half a pack/day noted alcohol - denied  Patient Active Problem List   Diagnosis Date Noted  . Hidradenitis suppurativa 01/21/2019  . Keratosis pilaris 02/16/2018  . Menorrhagia with regular cycle 02/16/2018  . IUD contraception   . Left ovarian cyst 01/18/2017    Past Surgical History:  Procedure Laterality Date  . TONSILLECTOMY AND ADENOIDECTOMY      Family History  Problem Relation Age of Onset  . Rheum arthritis Maternal Grandmother   . Obesity Mother   . Hypertension Father   . ADD / ADHD Brother   . Breast cancer Neg Hx   . Ovarian cancer Neg Hx   . Colon  cancer Neg Hx     Social History   Tobacco Use  . Smoking status: Current Every Day Smoker    Packs/day: 0.50    Types: Cigarettes  . Smokeless tobacco: Never Used  Substance Use Topics  . Alcohol use: Yes    Comment: rare     Current Outpatient Medications:  .  levonorgestrel (MIRENA) 20 MCG/24HR IUD, 1 each by Intrauterine route once., Disp: , Rfl:   No Known Allergies  With staff assistance, above reviewed with the patient today.   ROS: As per HPI, otherwise no specific complaints on a limited and focused system review   Objective  Virtual encounter, vitals not obtained.  Body mass index is  35.88 kg/m.  Physical Exam  Patient appears in NAD, pleasant, some obvious nasal congestion evident, conjunctiva not markedly injected, no marked coughing episodes during our video visit Pulmonary/Chest: Effort normal. No respiratory distress. Speaking in complete sentences Neurological: Pt is alert and oriented,  Speech is normal.  Psychiatric: Patient has a normal mood and affect, behavior is normal. Very appropriate with conversation, judgment and thought content normal.   No results found for this or any previous visit (from the past 72 hour(s)).  PHQ2/9: Depression screen [facility] 2/9 07/26/2019 02/16/2018  Decreased Interest 0 0  Down, Depressed, Hopeless 0 0  PHQ - 2 Score 0 0  Altered sleeping 0 0  Tired, decreased energy 2 0  Change in appetite 0 0  Feeling bad or failure about yourself  0 0  Trouble concentrating 0 0  Moving slowly or fidgety/restless 0 0  Suicidal thoughts 0 0  PHQ-9 Score 2 0  Difficult doing work/chores Not difficult at all Not difficult at all   PHQ-2/9 Result reviewed   Fall Risk: Fall Risk  07/26/2019 02/16/2018  Falls in the past year? 0 0  Number falls in past yr: 0 -  Injury with Fall? 0 -     Assessment & Plan  1. Bronchitis Discussed her illness at length, and could still be a Covid infection concern.  Discussed the limitations of the video visit, and the ideal being able to listen to her chest presently.  Her initial test done with her mom's doctor was negative.  (A rapid test).  She has not had loss of taste or smell, no GI symptoms of concern, and no marked body aches.  Temperature elevations remain low-grade by report.  Discussed the potential of a false negative test and is a possibility. Felt best at this point to add an antibiotic with azithromycin added.  Will take to the first day then 1 a day for 4 more days. Also a Mucinex DM product recommended in combination, more as needed. Also can take an ibuprofen or Tylenol type product to  help with any low-grade temps, any increased achiness, with the ibuprofen product preferred if her stomach does not have side effects, and if it does, taking Tylenol in its place. Emphasized increasing fluids and rest, with no return to work presently, and not to until symptoms markedly improved.  Her work does involve being around people, and contagious concerns exist. Did discuss getting another Covid test, and agreed if her symptoms are not significantly better over the next 2 to 3 days, having a Covid test done, with a phone number given for her to call to schedule to have that done.  If the timing does not allow through the common system, and get a test at a local pharmacy again as well.  We will hold on adding another steroid at this point She asked for a work note, and will try to get her along with Melissa's help noting she should not return until her symptoms are significantly improved, and if she does get a Covid test, need to wait for that result prior to returning. Also noted if her symptoms significantly worsen with high fevers, increased shortness of breath, nausea/vomiting, she likely will need to be seen in an urgent care/emergency setting, and she was understanding of that. - azithromycin (ZITHROMAX) 250 MG tablet; Take as directed  Dispense: 6 tablet; Refill: 0  2. Tobacco dependence Strongly encouraged lessening her cigarette use to get to complete cessation, and the importance of that over time.  She will follow-up again if not improving or more problematic   I discussed the assessment and treatment plan with the patient. The patient was provided an opportunity to ask questions and all were answered. The patient agreed with the plan and demonstrated an understanding of the instructions.  Red flags and when to present for emergency care or RTC including higher fevers, increasing chest pain, shortness of breath, new/worsening/un-resolving symptoms reviewed with patient at time of  visit.  The patient was advised to call back or seek an in-person evaluation if the symptoms worsen or if the condition fails to improve as anticipated.  I provided 20 minutes of non-face-to-face time during this encounter that included discussing at length patient's sx/history, pertinent pmhx, medications, treatment and follow up plan. This time also included the necessary documentation, orders, and chart review.

## 2019-11-18 ENCOUNTER — Encounter: Payer: BC Managed Care – PPO | Admitting: Certified Nurse Midwife

## 2019-11-29 ENCOUNTER — Encounter: Payer: Self-pay | Admitting: Certified Nurse Midwife

## 2020-01-15 DIAGNOSIS — Z20822 Contact with and (suspected) exposure to covid-19: Secondary | ICD-10-CM | POA: Diagnosis not present

## 2020-01-19 DIAGNOSIS — Z20822 Contact with and (suspected) exposure to covid-19: Secondary | ICD-10-CM | POA: Diagnosis not present

## 2020-01-27 ENCOUNTER — Telehealth: Payer: Self-pay

## 2020-01-27 NOTE — Telephone Encounter (Signed)
Called mobile number- # disconnected. Called home phone- a woman answered and said pt was not there and hung up.  Called pt in regards to IUD. Patient wanted to know when it was placed and when it is due to be removed.

## 2020-01-27 NOTE — Telephone Encounter (Signed)
Pt called in and stated that she was unsure when she had her IUD nserted and that she is wanting to know when and is it time to take it out. The pt is requesting a call back. Please advise

## 2020-03-16 ENCOUNTER — Encounter: Payer: Self-pay | Admitting: Certified Nurse Midwife

## 2020-04-20 DIAGNOSIS — J069 Acute upper respiratory infection, unspecified: Secondary | ICD-10-CM | POA: Diagnosis not present

## 2020-04-20 DIAGNOSIS — Z20822 Contact with and (suspected) exposure to covid-19: Secondary | ICD-10-CM | POA: Diagnosis not present

## 2020-05-08 ENCOUNTER — Encounter: Payer: Self-pay | Admitting: Certified Nurse Midwife

## 2020-05-29 NOTE — Progress Notes (Deleted)
Name: Maureen Gutierrez   MRN: [id number]    DOB: 10/16/2000   Date:05/29/2020       Progress Note  Subjective  Chief Complaint  Annual Exam  HPI  Patient presents for annual CPE.  Diet: *** Exercise: ***   Flowsheet Row Office Visit from 07/26/2019 in [facility]  AUDIT-C Score 0     Depression: Phq 9 is  {Desc; negative/positive:13464::"negative"} Depression screen [facility] 2/9 07/26/2019 02/16/2018  Decreased Interest 0 0  Down, Depressed, Hopeless 0 0  PHQ - 2 Score 0 0  Altered sleeping 0 0  Tired, decreased energy 2 0  Change in appetite 0 0  Feeling bad or failure about yourself  0 0  Trouble concentrating 0 0  Moving slowly or fidgety/restless 0 0  Suicidal thoughts 0 0  PHQ-9 Score 2 0  Difficult doing work/chores Not difficult at all Not difficult at all   Hypertension: BP Readings from Last 3 Encounters:  01/29/19 126/86  01/21/19 110/79  09/15/18 121/77   Obesity: Wt Readings from Last 3 Encounters:  07/26/19 236 lb (107 kg) (>99 %, Z= 2.38)*  01/29/19 236 lb 12.8 oz (107.4 kg) (>99 %, Z= 2.37)*  01/21/19 238 lb 1.6 oz (108 kg) (>99 %, Z= 2.39)*   * Growth percentiles are based on CDC (Girls, 2-20 Years) data.   BMI Readings from Last 3 Encounters:  07/26/19 35.88 kg/m (97 %, Z= 1.95)*  01/29/19 36.01 kg/m (98 %, Z= 1.99)*  01/21/19 36.20 kg/m (98 %, Z= 2.00)*   * Growth percentiles are based on CDC (Girls, 2-20 Years) data.     Vaccines:  HPV: up to at age 7 , ask insurance if age between 47-45  Shingrix: 19-64 yo and ask insurance if covered when patient above 19 yo Pneumonia: educated and discussed with patient. Flu: educated and discussed with patient.  Hep C Screening: N/A STD testing and prevention (HIV/chl/gon/syphilis): Order placed today Intimate partner violence: negative Sexual History : Menstrual History/LMP/Abnormal Bleeding:  Incontinence Symptoms:   Breast cancer:  - Last Mammogram: N/A - BRCA gene  screening: N/A  Osteoporosis: Discussed high calcium and vitamin D supplementation, weight bearing exercises  Cervical cancer screening: N/A  Skin cancer: Discussed monitoring for atypical lesions  Colorectal cancer: N/A  Lung cancer:  Low Dose CT Chest recommended if Age 35-80 years, 20 pack-year currently smoking OR have quit w/in 15years. Patient does not qualify.   ECG: N/A  Advanced Care Planning: A voluntary discussion about advance care planning including the explanation and discussion of advance directives.  Discussed health care proxy and Living will, and the patient was able to identify a health care proxy as ***.  Patient does not have a living will at present time. If patient does have living will, I have requested they bring this to the clinic to be scanned in to their chart.  Lipids: No results found for: CHOL No results found for: HDL No results found for: LDLCALC No results found for: TRIG No results found for: CHOLHDL No results found for: LDLDIRECT  Glucose: Glucose  Date Value Ref Range Status  11/08/2016 99 65 - 99 mg/dL Final  10/08/2016 95 65 - 99 mg/dL Final    Patient Active Problem List   Diagnosis Date Noted  . Tobacco dependence 07/26/2019  . Hidradenitis suppurativa 01/21/2019  . Keratosis pilaris 02/16/2018  . Menorrhagia with regular cycle 02/16/2018  . IUD contraception   . Left ovarian cyst 01/18/2017    Past  Surgical History:  Procedure Laterality Date  . TONSILLECTOMY AND ADENOIDECTOMY      Family History  Problem Relation Age of Onset  . Rheum arthritis Maternal Grandmother   . Obesity Mother   . Hypertension Father   . ADD / ADHD Brother   . Breast cancer Neg Hx   . Ovarian cancer Neg Hx   . Colon cancer Neg Hx     Social History   Socioeconomic History  . Marital status: Single    Spouse name: Not on file  . Number of children: 0  . Years of education: Not on file  . Highest education level: High school graduate   Occupational History  . Occupation: Programme researcher, broadcasting/film/video    Comment: [employer]  . Occupation: Scientist, water quality    Comment: [employer]  . Occupation: Hosts    Comment: [employer]  Tobacco Use  . Smoking status: Current Every Day Smoker    Packs/day: 0.50    Types: Cigarettes  . Smokeless tobacco: Never Used  Vaping Use  . Vaping Use: Former  . Start date: 01/30/2018  . Substances: Flavoring  Substance and Sexual Activity  . Alcohol use: Yes    Comment: rare  . Drug use: No  . Sexual activity: Yes    Partners: Male    Birth control/protection: I.U.D.  Other Topics Concern  . Not on file  Social History Narrative   Works 6 days a week at [employer] to [employer] and going to [employer] to get a 2 year college degree   Social Determinants of Radio broadcast assistant Strain: Not on file  Food Insecurity: Not on file  Transportation Needs: Not on file  Physical Activity: Not on file  Stress: Not on file  Social Connections: Not on file  Intimate Partner Violence: Not on file     Current Outpatient Medications:  .  azithromycin (ZITHROMAX) 250 MG tablet, Take as directed, Disp: 6 tablet, Rfl: 0 .  levonorgestrel (MIRENA) 20 MCG/24HR IUD, 1 each by Intrauterine route once., Disp: , Rfl:   No Known Allergies   ROS  ***  Objective  There were no vitals filed for this visit.  There is no height or weight on file to calculate BMI.  Physical Exam ***  No results found for this or any previous visit (from the past 2160 hour(s)).  Diabetic Foot Exam: Diabetic Foot Exam - Simple   No data filed    ***  Fall Risk: Fall Risk  07/26/2019 02/16/2018  Falls in the past year? 0 0  Number falls in past yr: 0 -  Injury with Fall? 0 -   ***  Functional Status Survey:   ***  Assessment & Plan  There are no diagnoses linked to this encounter.  -USPSTF grade A and B recommendations reviewed with patient; age-appropriate recommendations, preventive care, screening tests, etc discussed and  encouraged; healthy living encouraged; see AVS for patient education given to patient -Discussed importance of 150 minutes of physical activity weekly, eat two servings of fish weekly, eat one serving of tree nuts ( cashews, pistachios, pecans, almonds.Marland Kitchen) every other day, eat 6 servings of fruit/vegetables daily and drink plenty of water and avoid sweet beverages.   -Reviewed Health Maintenance: ***

## 2020-05-29 NOTE — Patient Instructions (Incomplete)
Preventive Care 21-21 Years Old, Female Preventive care refers to lifestyle choices and visits with your health care provider that can promote health and wellness. This includes:  A yearly physical exam. This is also called an annual wellness visit.  Regular dental and eye exams.  Immunizations.  Screening for certain conditions.  Healthy lifestyle choices, such as: ? Eating a healthy diet. ? Getting regular exercise. ? Not using drugs or products that contain nicotine and tobacco. ? Limiting alcohol use. What can I expect for my preventive care visit? Physical exam Your health care provider may check your:  Height and weight. These may be used to calculate your BMI (body mass index). BMI is a measurement that tells if you are at a healthy weight.  Heart rate and blood pressure.  Body temperature.  Skin for abnormal spots. Counseling Your health care provider may ask you questions about your:  Past medical problems.  Family's medical history.  Alcohol, tobacco, and drug use.  Emotional well-being.  Home life and relationship well-being.  Sexual activity.  Diet, exercise, and sleep habits.  Work and work environment.  Access to firearms.  Method of birth control.  Menstrual cycle.  Pregnancy history. What immunizations do I need? Vaccines are usually given at various ages, according to a schedule. Your health care provider will recommend vaccines for you based on your age, medical history, and lifestyle or other factors, such as travel or where you work.   What tests do I need? Blood tests  Lipid and cholesterol levels. These may be checked every 5 years starting at age 20.  Hepatitis C test.  Hepatitis B test. Screening  Diabetes screening. This is done by checking your blood sugar (glucose) after you have not eaten for a while (fasting).  STD (sexually transmitted disease) testing, if you are at risk.  BRCA-related cancer screening. This may be  done if you have a family history of breast, ovarian, tubal, or peritoneal cancers.  Pelvic exam and Pap test. This may be done every 3 years starting at age 21. Starting at age 30, this may be done every 5 years if you have a Pap test in combination with an HPV test. Talk with your health care provider about your test results, treatment options, and if necessary, the need for more tests.   Follow these instructions at home: Eating and drinking  Eat a healthy diet that includes fresh fruits and vegetables, whole grains, lean protein, and low-fat dairy products.  Take vitamin and mineral supplements as recommended by your health care provider.  Do not drink alcohol if: ? Your health care provider tells you not to drink. ? You are pregnant, may be pregnant, or are planning to become pregnant.  If you drink alcohol: ? Limit how much you have to 0-1 drink a day. ? Be aware of how much alcohol is in your drink. In the [place] one drink equals one 12 oz bottle of beer (355 mL), one 5 oz glass of wine (148 mL), or one 1 oz glass of hard liquor (44 mL).   Lifestyle  Take daily care of your teeth and gums. Brush your teeth every morning and night with fluoride toothpaste. Floss one time each day.  Stay active. Exercise for at least 30 minutes 5 or more days each week.  Do not use any products that contain nicotine or tobacco, such as cigarettes, e-cigarettes, and chewing tobacco. If you need help quitting, ask your health care provider.  Do not   use drugs.  If you are sexually active, practice safe sex. Use a condom or other form of protection to prevent STIs (sexually transmitted infections).  If you do not wish to become pregnant, use a form of birth control. If you plan to become pregnant, see your health care provider for a prepregnancy visit.  Find healthy ways to cope with stress, such as: ? Meditation, yoga, or listening to music. ? Journaling. ? Talking to a trusted  person. ? Spending time with friends and family. Safety  Always wear your seat belt while driving or riding in a vehicle.  Do not drive: ? If you have been drinking alcohol. Do not ride with someone who has been drinking. ? When you are tired or distracted. ? While texting.  Wear a helmet and other protective equipment during sports activities.  If you have firearms in your house, make sure you follow all gun safety procedures.  Seek help if you have been physically or sexually abused. What's next?  Go to your health care provider once a year for an annual wellness visit.  Ask your health care provider how often you should have your eyes and teeth checked.  Stay up to date on all vaccines. This information is not intended to replace advice given to you by your health care provider. Make sure you discuss any questions you have with your health care provider. Document Revised: 11/14/2019 Document Reviewed: 11/27/2017 Elsevier Patient Education  2021 Elsevier Inc.  

## 2020-05-30 ENCOUNTER — Encounter: Payer: Self-pay | Admitting: Family Medicine

## 2020-07-03 ENCOUNTER — Other Ambulatory Visit: Payer: Self-pay

## 2020-07-03 ENCOUNTER — Ambulatory Visit (INDEPENDENT_AMBULATORY_CARE_PROVIDER_SITE_OTHER): Payer: BC Managed Care – PPO | Admitting: Certified Nurse Midwife

## 2020-07-03 ENCOUNTER — Other Ambulatory Visit (HOSPITAL_COMMUNITY)
Admission: RE | Admit: 2020-07-03 | Discharge: 2020-07-03 | Disposition: A | Discharge status: Home / Self Care | Payer: Self-pay | Source: Ambulatory Visit | Attending: Certified Nurse Midwife | Admitting: Certified Nurse Midwife

## 2020-07-03 VITALS — BP 135/85 | HR 108 | Resp 16 | Ht 69.0 in | Wt 252.1 lb

## 2020-07-03 DIAGNOSIS — N898 Other specified noninflammatory disorders of vagina: Secondary | ICD-10-CM

## 2020-07-03 DIAGNOSIS — Z202 Contact with and (suspected) exposure to infections with a predominantly sexual mode of transmission: Secondary | ICD-10-CM

## 2020-07-03 DIAGNOSIS — Z975 Presence of (intrauterine) contraceptive device: Secondary | ICD-10-CM | POA: Diagnosis not present

## 2020-07-03 NOTE — Patient Instructions (Addendum)
Safe Sex Practicing safe sex means taking steps before and during sex to reduce your risk of:  Getting an STI (sexually transmitted infection).  Giving your partner an STI.  Unwanted or unplanned pregnancy. How to practice safe sex Ways you can practice safe sex  Limit your sexual partners to only one partner who is having sex with only you.  Avoid using alcohol and drugs before having sex. Alcohol and drugs can affect your judgment.  Before having sex with a new partner: ? Talk to your partner about past partners, past STIs, and drug use. ? Get screened for STIs and discuss the results with your partner. Ask your partner to get screened too.  Check your body regularly for sores, blisters, rashes, or unusual discharge. If you notice any of these problems, visit your health care provider.  Avoid sexual contact if you have symptoms of an infection or you are being treated for an STI.  While having sex, use a condom. Make sure to: ? Use a condom every time you have vaginal, oral, or anal sex. Both females and males should wear condoms during oral sex. ? Keep condoms in place from the beginning to the end of sexual activity. ? Use a latex condom, if possible. Latex condoms offer the best protection. ? Use only water-based lubricants with a condom. Using petroleum-based lubricants or oils will weaken the condom and increase the chance that it will break.   Ways your health care provider can help you practice safe sex  See your health care provider for regular screenings, exams, and tests for STIs.  Talk with your health care provider about what kind of birth control (contraception) is best for you.  Get vaccinated against hepatitis B and human papillomavirus (HPV).  If you are at risk of being infected with HIV (human immunodeficiency virus), talk with your health care provider about taking a prescription medicine to prevent HIV infection. You are at risk for HIV if you: ? Are a man  who has sex with other men. ? Are sexually active with more than one partner. ? Take drugs by injection. ? Have a sex partner who has HIV. ? Have unprotected sex. ? Have sex with someone who has sex with both men and women. ? Have had an STI.   Follow these instructions at home:  Take over-the-counter and prescription medicines only as told by your health care provider.  Keep all follow-up visits. This is important. Where to find more information  Centers for Disease Control and Prevention: www.cdc.gov  Planned Parenthood: www.plannedparenthood.org  Office on Women's Health: www.womenshealth.gov Summary  Practicing safe sex means taking steps before and during sex to reduce your risk getting an STI, giving your partner an STI, and having an unwanted or unplanned pregnancy.  Before having sex with a new partner, talk to your partner about past partners, past STIs, and drug use.  Use a condom every time you have vaginal, oral, or anal sex. Both females and males should wear condoms during oral sex.  Check your body regularly for sores, blisters, rashes, or unusual discharge. If you notice any of these problems, visit your health care provider.  See your health care provider for regular screenings, exams, and tests for STIs. This information is not intended to replace advice given to you by your health care provider. Make sure you discuss any questions you have with your health care provider. Document Revised: 08/23/2019 Document Reviewed: 08/23/2019 Elsevier Patient Education  2021 Elsevier Inc.  

## 2020-07-03 NOTE — Progress Notes (Signed)
GYN ENCOUNTER NOTE  Subjective:       Maureen Gutierrez is a 20 y.o. G0P0000 female is here for gynecologic evaluation of the following issues:  1. Vaginal itching, discharge and irritation for the last few months 2. Dyspareunia  Patient wishes to swab for infections, treat if positive, and then return for IUD removal since pregnancy is desired.   Denies difficulty breathing or respiratory distress, chest pain, abdominal pain, vaginal bleeding, dysuria, and leg pain or swelling.    Gynecologic History  No LMP recorded. (Menstrual status: IUD).  Contraception: IUD, Mirena inserted 09/2016  Last Pap: N/A   Obstetric History  OB History  Gravida Para Term Preterm AB Living  0 0 0 0 0 0  SAB IAB Ectopic Multiple Live Births  0 0 0 0 0    Past Medical History:  Diagnosis Date  . Eczema   . Heavy periods   . Ovarian cyst    left - [name]  . Painful menstrual periods     Past Surgical History:  Procedure Laterality Date  . TONSILLECTOMY AND ADENOIDECTOMY      Current Outpatient Medications on File Prior to Visit  Medication Sig Dispense Refill  . levonorgestrel (MIRENA) 20 MCG/24HR IUD 1 each by Intrauterine route once.     No current facility-administered medications on file prior to visit.    No Known Allergies  Social History   Socioeconomic History  . Marital status: Single    Spouse name: Not on file  . Number of children: 0  . Years of education: Not on file  . Highest education level: High school graduate  Occupational History  . Occupation: Production assistant, radio    Comment: [employer]  . Occupation: Conservation officer, nature    Comment: [employer]  . Occupation: Hosts    Comment: [employer]  Tobacco Use  . Smoking status: Current Every Day Smoker    Packs/day: 0.50    Types: Cigarettes  . Smokeless tobacco: Never Used  Vaping Use  . Vaping Use: Former  . Start date: 01/30/2018  . Substances: Flavoring  Substance and Sexual Activity  . Alcohol use:  Yes    Comment: rare  . Drug use: No  . Sexual activity: Yes    Partners: Male    Birth control/protection: I.U.D.  Other Topics Concern  . Not on file  Social History Narrative   Works 6 days a week at [employer] to [employer] and going to [employer] to get a 2 year college degree   Social Determinants of Corporate investment banker Strain: Not on file  Food Insecurity: Not on file  Transportation Needs: Not on file  Physical Activity: Not on file  Stress: Not on file  Social Connections: Not on file  Intimate Partner Violence: Not on file    Family History  Problem Relation Age of Onset  . Rheum arthritis Maternal Grandmother   . Obesity Mother   . Hypertension Father   . ADD / ADHD Brother   . Breast cancer Neg Hx   . Ovarian cancer Neg Hx   . Colon cancer Neg Hx     The following portions of the patient's history were reviewed and updated as appropriate: allergies, current medications, past family history, past medical history, past social history, past surgical history and problem list.  Review of Systems  ROS negative except as noted above. Information obtained from patient.   Objective:   BP 135/85   Pulse (!) 108  Resp 16   Ht 5\' 9"  (1.753 m)   Wt 252 lb 1.6 oz (114.4 kg)   BMI 37.23 kg/m   CONSTITUTIONAL: Well-developed, well-nourished female in no acute distress.   PELVIC: Declined by patient.    Assessment:   1. IUD (intrauterine device) in place  - Cervicovaginal ancillary only  2. Vaginal itching  - Cervicovaginal ancillary only  3. Vaginal irritation  - Cervicovaginal ancillary only  4. Vaginal discharge  - Cervicovaginal ancillary only  5. Possible exposure to STD  - Cervicovaginal ancillary only   Plan:   Patient declines exam. Vaginal self swab collected, see orders.   Preparing for Pregnancy education provided, see AVS.   Samples of prenatal vitamins given.   Reviewed red flag symptoms and when to call.   RTC x 4 weeks for TOC and  IUD removal or sooner if needed.    , CNM [facility], [facility] 07/03/20 4:13 PM

## 2020-07-05 LAB — CERVICOVAGINAL ANCILLARY ONLY
Bacterial Vaginitis (gardnerella): POSITIVE — AB
Candida Glabrata: NEGATIVE
Candida Vaginitis: NEGATIVE
Chlamydia: NEGATIVE
Comment: NEGATIVE
Comment: NEGATIVE
Comment: NEGATIVE
Comment: NEGATIVE
Comment: NEGATIVE
Comment: NORMAL
Neisseria Gonorrhea: NEGATIVE
Trichomonas: POSITIVE — AB

## 2020-07-06 ENCOUNTER — Telehealth: Payer: Self-pay | Admitting: Certified Nurse Midwife

## 2020-07-06 ENCOUNTER — Other Ambulatory Visit: Payer: Self-pay | Admitting: Certified Nurse Midwife

## 2020-07-06 DIAGNOSIS — B9689 Other specified bacterial agents as the cause of diseases classified elsewhere: Secondary | ICD-10-CM

## 2020-07-06 DIAGNOSIS — A5901 Trichomonal vulvovaginitis: Secondary | ICD-10-CM

## 2020-07-06 MED ORDER — METRONIDAZOLE 500 MG PO TABS: 500.0000 mg | ORAL_TABLET | Freq: Two times a day (BID) | ORAL | 0 refills | Status: AC

## 2020-07-06 NOTE — Progress Notes (Signed)
Rx Flagyl, see orders.    [name], CNM [facility], [facility] 07/06/20 4:34 PM

## 2020-07-06 NOTE — Telephone Encounter (Signed)
Maureen Gutierrez called this afternoon in response to the MyChart message she received.  Maureen Gutierrez would like Maureen Gutierrez to call her if at all possible tonight.  Maureen Gutierrez states she and her boyfriend did not have sex and that she only performed oral sex on him.  Maureen Gutierrez is nervous and upset because she states her boyfriend does not have a PCP and is wanting to know if Maureen Gutierrez can call something in for her boyfriend.  Maureen Gutierrez just has several questions about her labs.  Please advise.

## 2020-07-07 ENCOUNTER — Telehealth: Payer: Self-pay | Admitting: Certified Nurse Midwife

## 2020-07-07 DIAGNOSIS — A5901 Trichomonal vulvovaginitis: Secondary | ICD-10-CM

## 2020-07-07 DIAGNOSIS — N760 Acute vaginitis: Secondary | ICD-10-CM

## 2020-07-07 DIAGNOSIS — B9689 Other specified bacterial agents as the cause of diseases classified elsewhere: Secondary | ICD-10-CM

## 2020-07-07 NOTE — Telephone Encounter (Signed)
HIPPA approved line asking patient to call back.    [name], CNM [facility], [facility] 07/07/20 2:48 PM

## 2020-07-07 NOTE — Telephone Encounter (Signed)
Results and treatment plan discussed with patient, verbalized understanding.   Apologized for assuming patient previously had intercourse with current partner, no longer recommend treatment.   Keep appointment as scheduled for [facility] and IUD removal.    [name], CNM [facility], [facility] 07/07/20 3:56 PM

## 2020-07-28 ENCOUNTER — Telehealth: Payer: Self-pay | Admitting: Certified Nurse Midwife

## 2020-07-28 NOTE — Telephone Encounter (Signed)
LMTRC

## 2020-07-28 NOTE — Telephone Encounter (Signed)
Maureen Gutierrez called in and states that she received a bill for service date 07/03/2020 and it states she didn't have insurance and received a discounted rate.  Maureen Gutierrez did provide insurance for that visit.  I pulled up her account and I am showing she has a zero balance.  I did tell Maureen Gutierrez sometimes they will shoot out a bill before the charges drop and are paid by the insurance.  I told her I thought this is what happened in this case.  So Maureen Gutierrez would like this looked into to see if she actually owes anything or not.  Please advise.

## 2020-07-31 ENCOUNTER — Other Ambulatory Visit: Payer: Self-pay

## 2020-07-31 ENCOUNTER — Ambulatory Visit (INDEPENDENT_AMBULATORY_CARE_PROVIDER_SITE_OTHER): Payer: BC Managed Care – PPO | Admitting: Certified Nurse Midwife

## 2020-07-31 ENCOUNTER — Other Ambulatory Visit (HOSPITAL_COMMUNITY)
Admission: RE | Admit: 2020-07-31 | Discharge: 2020-07-31 | Disposition: A | Discharge status: Home / Self Care | Payer: BC Managed Care – PPO | Source: Ambulatory Visit | Attending: Certified Nurse Midwife | Admitting: Certified Nurse Midwife

## 2020-07-31 ENCOUNTER — Encounter: Payer: Self-pay | Admitting: Certified Nurse Midwife

## 2020-07-31 VITALS — BP 126/87 | HR 101 | Resp 16 | Ht 69.0 in | Wt 255.2 lb

## 2020-07-31 DIAGNOSIS — A5901 Trichomonal vulvovaginitis: Secondary | ICD-10-CM | POA: Insufficient documentation

## 2020-07-31 DIAGNOSIS — Z30432 Encounter for removal of intrauterine contraceptive device: Secondary | ICD-10-CM | POA: Diagnosis not present

## 2020-07-31 DIAGNOSIS — Z113 Encounter for screening for infections with a predominantly sexual mode of transmission: Secondary | ICD-10-CM | POA: Diagnosis not present

## 2020-07-31 DIAGNOSIS — Z5901 Sheltered homelessness: Secondary | ICD-10-CM | POA: Diagnosis present

## 2020-07-31 NOTE — Progress Notes (Signed)
Maureen Gutierrez is a 20 y.o. year old G37P0000 Caucasian female who presents for removal of a Mirena IUD. Her Mirena IUD was placed 09/2016.   BP 126/87   Pulse (!) 101   Resp 16   Ht 5\' 9"  (1.753 m)   Wt 255 lb 3.2 oz (115.8 kg)   BMI 37.69 kg/m   Time out was performed.  A small plastic speculum was placed in the vagina.  The cervix was visualized, a test of cure was collected, and the strings were visible. They were grasped and the Mirena was easily removed intact without complications.   RTC as needed.    , CNM [facility], [facility] 07/31/20 9:25 AM

## 2020-07-31 NOTE — Patient Instructions (Signed)
Preventive Care 21-21 Years Old, Female Preventive care refers to lifestyle choices and visits with your health care provider that can promote health and wellness. This includes:  A yearly physical exam. This is also called an annual wellness visit.  Regular dental and eye exams.  Immunizations.  Screening for certain conditions.  Healthy lifestyle choices, such as: ? Eating a healthy diet. ? Getting regular exercise. ? Not using drugs or products that contain nicotine and tobacco. ? Limiting alcohol use. What can I expect for my preventive care visit? Physical exam Your health care provider may check your:  Height and weight. These may be used to calculate your BMI (body mass index). BMI is a measurement that tells if you are at a healthy weight.  Heart rate and blood pressure.  Body temperature.  Skin for abnormal spots. Counseling Your health care provider may ask you questions about your:  Past medical problems.  Family's medical history.  Alcohol, tobacco, and drug use.  Emotional well-being.  Home life and relationship well-being.  Sexual activity.  Diet, exercise, and sleep habits.  Work and work environment.  Access to firearms.  Method of birth control.  Menstrual cycle.  Pregnancy history. What immunizations do I need? Vaccines are usually given at various ages, according to a schedule. Your health care provider will recommend vaccines for you based on your age, medical history, and lifestyle or other factors, such as travel or where you work.   What tests do I need? Blood tests  Lipid and cholesterol levels. These may be checked every 5 years starting at age 20.  Hepatitis C test.  Hepatitis B test. Screening  Diabetes screening. This is done by checking your blood sugar (glucose) after you have not eaten for a while (fasting).  STD (sexually transmitted disease) testing, if you are at risk.  BRCA-related cancer screening. This may be  done if you have a family history of breast, ovarian, tubal, or peritoneal cancers.  Pelvic exam and Pap test. This may be done every 3 years starting at age 21. Starting at age 30, this may be done every 5 years if you have a Pap test in combination with an HPV test. Talk with your health care provider about your test results, treatment options, and if necessary, the need for more tests.   Follow these instructions at home: Eating and drinking  Eat a healthy diet that includes fresh fruits and vegetables, whole grains, lean protein, and low-fat dairy products.  Take vitamin and mineral supplements as recommended by your health care provider.  Do not drink alcohol if: ? Your health care provider tells you not to drink. ? You are pregnant, may be pregnant, or are planning to become pregnant.  If you drink alcohol: ? Limit how much you have to 0-1 drink a day. ? Be aware of how much alcohol is in your drink. In the [place] one drink equals one 12 oz bottle of beer (355 mL), one 5 oz glass of wine (148 mL), or one 1 oz glass of hard liquor (44 mL).   Lifestyle  Take daily care of your teeth and gums. Brush your teeth every morning and night with fluoride toothpaste. Floss one time each day.  Stay active. Exercise for at least 30 minutes 5 or more days each week.  Do not use any products that contain nicotine or tobacco, such as cigarettes, e-cigarettes, and chewing tobacco. If you need help quitting, ask your health care provider.  Do not   use drugs.  If you are sexually active, practice safe sex. Use a condom or other form of protection to prevent STIs (sexually transmitted infections).  If you do not wish to become pregnant, use a form of birth control. If you plan to become pregnant, see your health care provider for a prepregnancy visit.  Find healthy ways to cope with stress, such as: ? Meditation, yoga, or listening to music. ? Journaling. ? Talking to a trusted  person. ? Spending time with friends and family. Safety  Always wear your seat belt while driving or riding in a vehicle.  Do not drive: ? If you have been drinking alcohol. Do not ride with someone who has been drinking. ? When you are tired or distracted. ? While texting.  Wear a helmet and other protective equipment during sports activities.  If you have firearms in your house, make sure you follow all gun safety procedures.  Seek help if you have been physically or sexually abused. What's next?  Go to your health care provider once a year for an annual wellness visit.  Ask your health care provider how often you should have your eyes and teeth checked.  Stay up to date on all vaccines. This information is not intended to replace advice given to you by your health care provider. Make sure you discuss any questions you have with your health care provider. Document Revised: 11/14/2019 Document Reviewed: 11/27/2017 Elsevier Patient Education  2021 Elsevier Inc.  

## 2020-08-01 ENCOUNTER — Ambulatory Visit: Payer: Self-pay | Admitting: *Deleted

## 2020-08-01 ENCOUNTER — Emergency Department: Payer: BC Managed Care – PPO

## 2020-08-01 ENCOUNTER — Emergency Department
Admission: EM | Admit: 2020-08-01 | Discharge: 2020-08-01 | Disposition: A | Discharge status: Home / Self Care | Payer: BC Managed Care – PPO | Attending: Emergency Medicine | Admitting: Emergency Medicine

## 2020-08-01 ENCOUNTER — Other Ambulatory Visit: Payer: Self-pay

## 2020-08-01 DIAGNOSIS — R0602 Shortness of breath: Secondary | ICD-10-CM | POA: Diagnosis not present

## 2020-08-01 DIAGNOSIS — R079 Chest pain, unspecified: Secondary | ICD-10-CM | POA: Insufficient documentation

## 2020-08-01 DIAGNOSIS — I2699 Other pulmonary embolism without acute cor pulmonale: Secondary | ICD-10-CM | POA: Diagnosis not present

## 2020-08-01 DIAGNOSIS — F17210 Nicotine dependence, cigarettes, uncomplicated: Secondary | ICD-10-CM | POA: Insufficient documentation

## 2020-08-01 DIAGNOSIS — R0789 Other chest pain: Secondary | ICD-10-CM | POA: Diagnosis not present

## 2020-08-01 LAB — BASIC METABOLIC PANEL
Anion gap: 10 (ref 5–15)
BUN: 6 mg/dL (ref 6–20)
CO2: 25 mmol/L (ref 22–32)
Calcium: 9.1 mg/dL (ref 8.9–10.3)
Chloride: 103 mmol/L (ref 98–111)
Creatinine, Ser: 0.5 mg/dL (ref 0.44–1.00)
GFR, Estimated: >60 mL/min (ref >60)
Glucose, Bld: 105 mg/dL — ABNORMAL HIGH (ref 70–99)
Potassium: 3.8 mmol/L (ref 3.5–5.1)
Sodium: 138 mmol/L (ref 135–145)

## 2020-08-01 LAB — CERVICOVAGINAL ANCILLARY ONLY
Bacterial Vaginitis (gardnerella): POSITIVE — AB
Candida Glabrata: NEGATIVE
Candida Vaginitis: NEGATIVE
Chlamydia: NEGATIVE
Comment: NEGATIVE
Comment: NEGATIVE
Comment: NEGATIVE
Comment: NEGATIVE
Comment: NEGATIVE
Comment: NORMAL
Neisseria Gonorrhea: NEGATIVE
Trichomonas: NEGATIVE

## 2020-08-01 LAB — TROPONIN I (HIGH SENSITIVITY)
Troponin I (High Sensitivity): 2 ng/L (ref <18)
Troponin I (High Sensitivity): <2 ng/L (ref <18)

## 2020-08-01 LAB — CBC
HCT: 41.8 % (ref 36.0–46.0)
Hemoglobin: 14.2 g/dL (ref 12.0–15.0)
MCH: 30.4 pg (ref 26.0–34.0)
MCHC: 34 g/dL (ref 30.0–36.0)
MCV: 89.5 fL (ref 80.0–100.0)
Platelets: 328 10*3/uL (ref 150–400)
RBC: 4.67 MIL/uL (ref 3.87–5.11)
RDW: 12.1 % (ref 11.5–15.5)
WBC: 13.5 10*3/uL — ABNORMAL HIGH (ref 4.0–10.5)
nRBC: 0 % (ref 0.0–0.2)

## 2020-08-01 LAB — TSH: TSH: 2.94 u[IU]/mL (ref 0.350–4.500)

## 2020-08-01 LAB — D-DIMER, QUANTITATIVE: D-Dimer, Quant: 0.51 ug/mL-FEU — ABNORMAL HIGH (ref 0.00–0.50)

## 2020-08-01 MED ORDER — HYDROXYZINE HCL 25 MG PO TABS: 25.0000 mg | ORAL_TABLET | Freq: Three times a day (TID) | ORAL | 0 refills | Status: DC | PRN

## 2020-08-01 MED ORDER — IOHEXOL 350 MG/ML SOLN
75.0000 mL | Freq: Once | INTRAVENOUS | Status: AC | PRN
  Administered 2020-08-01: 75 mL via INTRAVENOUS

## 2020-08-01 MED ORDER — HYDROXYZINE HCL 25 MG PO TABS
25.0000 mg | ORAL_TABLET | Freq: Once | ORAL | Status: AC
  Administered 2020-08-01: 25 mg via ORAL
  Filled 2020-08-01: qty 1

## 2020-08-01 NOTE — Telephone Encounter (Signed)
Pt called in c/o chest pain that is radiating into her shoulders and upper back and jaw.   This has been happening intermittently for the last week and a half.   She has a racing heart and shortness of breath along with nausea and sweating when these episodes occur. She had an IUD removed yesterday at the gyn office.    Had these symptoms yesterday but did not say anything because they were so busy in the gyn office.  I have referred to the ED.   She is agreeable to going.   Going to [facility].  I forwarded my notes to [facility] for Dr. Carlynn Purl.    Reason for Disposition . Pain also in shoulder(s) or arm(s) or jaw (Exception: pain is clearly made worse by movement)    Had IUD removed yesterday.  No cardiac history.  Answer Assessment - Initial Assessment Questions 1. LOCATION: "Where does it hurt?"       I had an IUD removed yesterday. For a week or more I'm having chest pain in the middle of my chest.   Having shortness of breath and a racing heart beat for a week and 1/2. I may have anxiety, I don't know. 2. RADIATION: "Does the pain go anywhere else?" (e.g., into neck, jaw, arms, back)     It's intermittent.  It'll last 20-30 minutes.   I break out in a sweat.    3. ONSET: "When did the chest pain begin?" (Minutes, hours or days)      A week and 1/2 ago. 4. PATTERN "Does the pain come and go, or has it been constant since it started?"  "Does it get worse with exertion?"      Intermittent  5. DURATION: "How long does it last" (e.g., seconds, minutes, hours)     Last 20-30 minutes then it stopped. 6. SEVERITY: "How bad is the pain?"  (e.g., Scale 1-10; mild, moderate, or severe)    - MILD (1-3): doesn't interfere with normal activities     - MODERATE (4-7): interferes with normal activities or awakens from sleep    - SEVERE (8-10): excruciating pain, unable to do any normal activities       9 on chest pain.   I feel nausea with the chest pain.     I have pain in my shoulder blades and into my upper back and jaw.     7. CARDIAC RISK FACTORS: "Do you have any history of heart problems or risk factors for heart disease?" (e.g., angina, prior heart attack; diabetes, high blood pressure, high cholesterol, smoker, or strong family history of heart disease)     No 8. PULMONARY RISK FACTORS: "Do you have any history of lung disease?"  (e.g., blood clots in lung, asthma, emphysema, birth control pills)     No 9. CAUSE: "What do you think is causing the chest pain?"     Maybe anxiety but don't really know 10. OTHER SYMPTOMS: "Do you have any other symptoms?" (e.g., dizziness, nausea, vomiting, sweating, fever, difficulty breathing, cough)       Sweating, shortness of breath, nausea when having the chest pain. 11. PREGNANCY: "Is there any chance you are pregnant?" "When was your last menstrual period?"       No   Had IUD removed yesterday.   She was having these symptoms yesterday at the gyn office but did not say anything because they were so busy.  Protocols used: CHEST PAIN-A-AH

## 2020-08-01 NOTE — ED Notes (Signed)
Patient transported to CT 

## 2020-08-01 NOTE — ED Triage Notes (Signed)
Pt c/o intermittent chest pain with nausea and SOB for the past week. Pt is ambulatory to triage , pt is in NAD

## 2020-08-01 NOTE — ED Provider Notes (Signed)
[facility] Emergency Department Provider Note  Time seen: 10:55 AM  I have reviewed the triage vital signs and the nursing notes.   HISTORY  Chief Complaint Chest Pain   HPI Maureen Gutierrez is a 20 y.o. female with no significant past medical history who presents to the emergency department for chest pains.  According to the patient over the past 1.5 weeks she has been experiencing intermittent chest pains.  States the pain will start as mild and then progressed to become moderate to severe occasionally with radiation to the jaw and shortness of breath.  States symptoms last approximately 30 minutes or so and then resolve.  States they have been occurring once or twice per day over the past 1.5 weeks.  Does not know of any provoking factors.  Denies any association with food.  Patient states a strong anxiety family history but she has never been diagnosed with panic attacks.  Denies any significant family cardiac history.  Past Medical History:  Diagnosis Date  . Eczema   . Heavy periods   . Ovarian cyst    left - [name]  . Painful menstrual periods     Patient Active Problem List   Diagnosis Date Noted  . Tobacco dependence 07/26/2019  . Hidradenitis suppurativa 01/21/2019  . Keratosis pilaris 02/16/2018  . Menorrhagia with regular cycle 02/16/2018  . IUD contraception   . Left ovarian cyst 01/18/2017    Past Surgical History:  Procedure Laterality Date  . TONSILLECTOMY AND ADENOIDECTOMY      Prior to Admission medications   Not on File    No Known Allergies  Family History  Problem Relation Age of Onset  . Rheum arthritis Maternal Grandmother   . Obesity Mother   . Hypertension Father   . ADD / ADHD Brother   . Breast cancer Neg Hx   . Ovarian cancer Neg Hx   . Colon cancer Neg Hx     Social History Social History   Tobacco Use  . Smoking status: Current Every Day Smoker    Packs/day: 0.50    Types:  Cigarettes  . Smokeless tobacco: Never Used  Vaping Use  . Vaping Use: Former  . Start date: 01/30/2018  . Substances: Flavoring  Substance Use Topics  . Alcohol use: Yes    Comment: rare  . Drug use: No    Review of Systems Constitutional: Negative for fever Cardiovascular: Sharp chest pain intermittent over the past 1.5 weeks.  None currently. Respiratory: Mild shortness of breath at times over the past 1.5 weeks.  None currently. Gastrointestinal: Negative for abdominal pain, vomiting Musculoskeletal: Negative for musculoskeletal complaints Neurological: Negative for headache All other ROS negative  ____________________________________________   PHYSICAL EXAM:  VITAL SIGNS: ED Triage Vitals [08/01/20 1018]  Enc Vitals Group     BP 131/84     Pulse Rate 98     Resp 17     Temp 98.1 F (36.7 C)     Temp Source Oral     SpO2 100 %     Weight 255 lb (115.7 kg)     Height 5\' 9"  (1.753 m)     Head Circumference      Peak Flow      Pain Score 5     Pain Loc      Pain Edu?      Excl. in GC?    Constitutional: Alert and oriented. Well appearing and in no distress. Eyes: Normal exam  ENT      Head: Normocephalic and atraumatic.      Mouth/Throat: Mucous membranes are moist. Cardiovascular: Normal rate, regular rhythm.  Respiratory: Normal respiratory effort without tachypnea nor retractions. Breath sounds are clear.  Moderate sternal tenderness to palpation. Gastrointestinal: Soft and nontender. No distention.   Musculoskeletal: Nontender with normal range of motion in all extremities. No lower extremity tenderness or edema. Neurologic:  Normal speech and language. No gross focal neurologic deficits Skin:  Skin is warm, dry and intact.  Psychiatric: Mood and affect are normal.   ____________________________________________    EKG  EKG viewed and interpreted by myself shows a normal sinus rhythm at 96 bpm with a narrow QRS, normal axis, normal interval, no  concerning ST changes  ____________________________________________    RADIOLOGY  Chest x-ray is negative. CTA is negative  ____________________________________________   INITIAL IMPRESSION / ASSESSMENT AND PLAN / ED COURSE  Pertinent labs & imaging results that were available during my care of the patient were reviewed by me and considered in my medical decision making (see chart for details).   Patient presents to the emergency department for intermittent chest pains over the past 1.5 weeks associated with shortness of breath at times.  Symptoms last approximate 30 minutes and then resolve on their own.  Overall the patient appears well, no distress.  Denies any chest pain or shortness of breath currently.  Chest pain is moderately reproducible on exam with sternal palpation.  We will check labs including cardiac enzymes.  We will also check a D-dimer and a TSH as a precaution.  We will continue to closely monitor.  Patient agreeable to plan of care.  Lab work is reassuring besides a very slightly elevated D-dimer.  We will proceed with CTA imaging of the chest.  CTA negative for abnormality.  We will discharge home with a trial of hydroxyzine as well as PCP follow-up.  Patient agreeable to plan of care  Maureen Gutierrez was evaluated in Emergency Department on 08/01/2020 for the symptoms described in the history of present illness. She was evaluated in the context of the global COVID-19 pandemic, which necessitated consideration that the patient might be at risk for infection with the SARS-CoV-2 virus that causes COVID-19. Institutional protocols and algorithms that pertain to the evaluation of patients at risk for COVID-19 are in a state of rapid change based on information released by regulatory bodies including the CDC and federal and state organizations. These policies and algorithms were followed during the patient's care in the  ED.  ____________________________________________   FINAL CLINICAL IMPRESSION(S) / ED DIAGNOSES  Chest pain   Minna Antis, MD 08/01/20 1302

## 2020-08-01 NOTE — ED Notes (Signed)
Pt stated her chest pain started two weeks ago; goes away and comes back; pain is 8/10 now; has sharp pain in her LLQ when she has cp as well as some "tight jaw pain"

## 2020-08-03 ENCOUNTER — Other Ambulatory Visit: Payer: Self-pay | Admitting: Certified Nurse Midwife

## 2020-08-03 DIAGNOSIS — N760 Acute vaginitis: Secondary | ICD-10-CM

## 2020-08-03 DIAGNOSIS — B9689 Other specified bacterial agents as the cause of diseases classified elsewhere: Secondary | ICD-10-CM

## 2020-08-03 MED ORDER — METRONIDAZOLE 500 MG PO TABS: 500.0000 mg | ORAL_TABLET | Freq: Two times a day (BID) | ORAL | 0 refills | Status: AC

## 2020-08-03 NOTE — Progress Notes (Signed)
Rx Flagyl, see orders.    [name], CNM [facility], [facility] 08/03/20 11:35 AM

## 2020-08-04 NOTE — Telephone Encounter (Signed)
LMTRC

## 2020-08-10 NOTE — Telephone Encounter (Signed)
LMTRC

## 2020-08-11 NOTE — Telephone Encounter (Signed)
LMTRC x 3. No response from pt.

## 2020-08-11 NOTE — Telephone Encounter (Signed)
Chart filed.

## 2020-08-25 ENCOUNTER — Ambulatory Visit: Payer: BC Managed Care – PPO | Admitting: Family Medicine

## 2020-09-05 ENCOUNTER — Other Ambulatory Visit: Payer: Self-pay

## 2020-09-05 ENCOUNTER — Ambulatory Visit (INDEPENDENT_AMBULATORY_CARE_PROVIDER_SITE_OTHER): Payer: BC Managed Care – PPO | Admitting: Unknown Physician Specialty

## 2020-09-05 ENCOUNTER — Encounter: Payer: Self-pay | Admitting: Unknown Physician Specialty

## 2020-09-05 DIAGNOSIS — F410 Panic disorder [episodic paroxysmal anxiety] without agoraphobia: Secondary | ICD-10-CM | POA: Diagnosis not present

## 2020-09-05 MED ORDER — CITALOPRAM HYDROBROMIDE 10 MG PO TABS: 10.0000 mg | ORAL_TABLET | Freq: Every day | ORAL | 3 refills | Status: DC

## 2020-09-05 NOTE — Patient Instructions (Addendum)
Counseling apps"  Book "Don't Panic"  Apps: Talk space [employer]

## 2020-09-05 NOTE — Assessment & Plan Note (Addendum)
Pt with panic and without mania features.  Start Citalopram 10 mg daily.  Recheck in 2 weeks.  I've explained to her that drugs of the SSRI class can have side effects such as weight gain, sexual dysfunction, insomnia, headache, nausea. These medications are generally effective at alleviating symptoms of anxiety and/or depression. Let me know if significant side effects do occur.  Recommended counseling

## 2020-09-05 NOTE — Progress Notes (Signed)
BP 110/72   Pulse 92   Temp 98.2 F (36.8 C) (Oral)   Resp 18   Ht 5\' 9"  (1.753 m)   Wt 252 lb 1.6 oz (114.4 kg)   SpO2 99%   BMI 37.23 kg/m    Subjective:    Patient ID: , female    DOB: 03-23-2001, 19 y.o.   MRN: 2001  HPI: Maureen Gutierrez is a 20 y.o. female  Chief Complaint  Patient presents with  . Anxiety    Follow up ER   Pt went to the ER for concerns of episodes of chest pain and SOB daily that lasted for 20-30 minutes.  Normal cardiovascular work-up in the ER and hydroxyzine which helps.  Suggested coming here for consideration of a daily prevention of anxiety.  Currently she is taking 2-3 hydoxyzine/day for her panic attacks.  Having a lot of stress.  Took 1/2 of grandmother's valium which mellowed her out for a period of time.  Panic attacks have been going on for a little over a month.    Admits to depression as having crying episodes.  It hits her when she is by herself.  Sleeps well.  Denies manic episodes.  Admits to getting tired more easily.    Depression screen [facility] 2/9 09/05/2020 07/03/2020 07/26/2019 02/16/2018  Decreased Interest 0 0 0 0  Down, Depressed, Hopeless 1 0 0 0  PHQ - 2 Score 1 0 0 0  Altered sleeping 0 - 0 0  Tired, decreased energy 0 - 2 0  Change in appetite 0 - 0 0  Feeling bad or failure about yourself  0 - 0 0  Trouble concentrating 0 - 0 0  Moving slowly or fidgety/restless 0 - 0 0  Suicidal thoughts 0 - 0 0  PHQ-9 Score 1 - 2 0  Difficult doing work/chores Not difficult at all - Not difficult at all Not difficult at all   Relevant past medical, surgical, family and social history reviewed and updated as indicated. Interim medical history since our last visit reviewed. Allergies and medications reviewed and updated.  Review of Systems  Constitutional: Positive for fatigue.  Gastrointestinal: Negative.   Genitourinary: Negative.     Per HPI unless specifically indicated above     Objective:    BP 110/72    Pulse 92   Temp 98.2 F (36.8 C) (Oral)   Resp 18   Ht 5\' 9"  (1.753 m)   Wt 252 lb 1.6 oz (114.4 kg)   SpO2 99%   BMI 37.23 kg/m   Wt Readings from Last 3 Encounters:  09/05/20 252 lb 1.6 oz (114.4 kg)  08/01/20 255 lb (115.7 kg)  07/31/20 255 lb 3.2 oz (115.8 kg)    Physical Exam Constitutional:      General: She is not in acute distress.    Appearance: Normal appearance. She is well-developed.  HENT:     Head: Normocephalic and atraumatic.  Eyes:     General: Lids are normal. No scleral icterus.       Right eye: No discharge.        Left eye: No discharge.     Conjunctiva/sclera: Conjunctivae normal.  Neck:     Vascular: No carotid bruit or JVD.  Cardiovascular:     Rate and Rhythm: Normal rate and regular rhythm.     Heart sounds: Normal heart sounds.  Pulmonary:     Effort: Pulmonary effort is normal.     Breath sounds: Normal  breath sounds.  Abdominal:     Palpations: There is no hepatomegaly or splenomegaly.  Musculoskeletal:        General: Normal range of motion.     Cervical back: Normal range of motion and neck supple.  Skin:    General: Skin is warm and dry.     Coloration: Skin is not pale.     Findings: No rash.  Neurological:     Mental Status: She is alert and oriented to person, place, and time.  Psychiatric:        Behavior: Behavior normal.        Thought Content: Thought content normal.        Judgment: Judgment normal.     Results for orders placed or performed during the hospital encounter of 08/01/20  Basic metabolic panel  Result Value Ref Range   Sodium 138 135 - 145 mmol/L   Potassium 3.8 3.5 - 5.1 mmol/L   Chloride 103 98 - 111 mmol/L   CO2 25 22 - 32 mmol/L   Glucose, Bld 105 (H) 70 - 99 mg/dL   BUN 6 6 - 20 mg/dL   Creatinine, Ser 0.35 0.44 - 1.00 mg/dL   Calcium 9.1 8.9 - 00.9 mg/dL   GFR, Estimated >38 >18 mL/min   Anion gap 10 5 - 15  CBC  Result Value Ref Range   WBC 13.5 (H) 4.0 - 10.5 K/uL   RBC 4.67 3.87 - 5.11  MIL/uL   Hemoglobin 14.2 12.0 - 15.0 g/dL   HCT 29.9 37.1 - 69.6 %   MCV 89.5 80.0 - 100.0 fL   MCH 30.4 26.0 - 34.0 pg   MCHC 34.0 30.0 - 36.0 g/dL   RDW 78.9 38.1 - 01.7 %   Platelets 328 150 - 400 K/uL   nRBC 0.0 0.0 - 0.2 %  D-dimer, quantitative  Result Value Ref Range   D-Dimer, Quant 0.51 (H) 0.00 - 0.50 ug/mL-FEU  TSH  Result Value Ref Range   TSH 2.940 0.350 - 4.500 uIU/mL  Troponin I (High Sensitivity)  Result Value Ref Range   Troponin I (High Sensitivity) 2 <18 ng/L  Troponin I (High Sensitivity)  Result Value Ref Range   Troponin I (High Sensitivity) <2 <18 ng/L      Assessment & Plan:   Problem List Items Addressed This Visit      Unprioritized   Panic disorder    Pt with panic and without mania features.  Start Citalopram 10 mg daily.  Recheck in 2 weeks.  I've explained to her that drugs of the SSRI class can have side effects such as weight gain, sexual dysfunction, insomnia, headache, nausea. These medications are generally effective at alleviating symptoms of anxiety and/or depression. Let me know if significant side effects do occur.  Recommended counseling        Relevant Medications   citalopram (CELEXA) 10 MG tablet       Follow up plan: Return in about 2 weeks (around 09/19/2020) for physical.

## 2020-09-26 ENCOUNTER — Encounter: Payer: BC Managed Care – PPO | Admitting: Unknown Physician Specialty

## 2020-11-13 DIAGNOSIS — Z79899 Other long term (current) drug therapy: Secondary | ICD-10-CM | POA: Diagnosis not present

## 2020-11-13 DIAGNOSIS — M542 Cervicalgia: Secondary | ICD-10-CM | POA: Diagnosis not present

## 2020-11-13 DIAGNOSIS — N914 Secondary oligomenorrhea: Secondary | ICD-10-CM | POA: Diagnosis not present

## 2020-11-13 DIAGNOSIS — G8929 Other chronic pain: Secondary | ICD-10-CM | POA: Diagnosis not present

## 2020-11-13 DIAGNOSIS — M549 Dorsalgia, unspecified: Secondary | ICD-10-CM | POA: Diagnosis not present

## 2020-11-13 DIAGNOSIS — F17210 Nicotine dependence, cigarettes, uncomplicated: Secondary | ICD-10-CM | POA: Diagnosis not present

## 2020-11-13 DIAGNOSIS — M25511 Pain in right shoulder: Secondary | ICD-10-CM | POA: Diagnosis not present

## 2020-11-13 DIAGNOSIS — M129 Arthropathy, unspecified: Secondary | ICD-10-CM | POA: Diagnosis not present

## 2020-11-13 DIAGNOSIS — E559 Vitamin D deficiency, unspecified: Secondary | ICD-10-CM | POA: Diagnosis not present

## 2020-11-13 DIAGNOSIS — Z1159 Encounter for screening for other viral diseases: Secondary | ICD-10-CM | POA: Diagnosis not present

## 2020-11-13 DIAGNOSIS — Z131 Encounter for screening for diabetes mellitus: Secondary | ICD-10-CM | POA: Diagnosis not present

## 2020-11-15 DIAGNOSIS — Z79899 Other long term (current) drug therapy: Secondary | ICD-10-CM | POA: Diagnosis not present

## 2021-04-04 DIAGNOSIS — L732 Hidradenitis suppurativa: Secondary | ICD-10-CM | POA: Diagnosis not present

## 2021-04-04 DIAGNOSIS — L718 Other rosacea: Secondary | ICD-10-CM | POA: Diagnosis not present

## 2021-07-06 ENCOUNTER — Ambulatory Visit: Payer: Self-pay | Admitting: Family Medicine

## 2021-07-11 ENCOUNTER — Ambulatory Visit: Payer: Self-pay | Admitting: Family Medicine

## 2021-09-20 IMAGING — CR DG CHEST 2V
2 series · 2 of 2 positions shown · non-contrast
Comparison: 01/27/2019.

CLINICAL DATA: Chest pain.

EXAM:
CHEST - 2 VIEW

[chest pa]
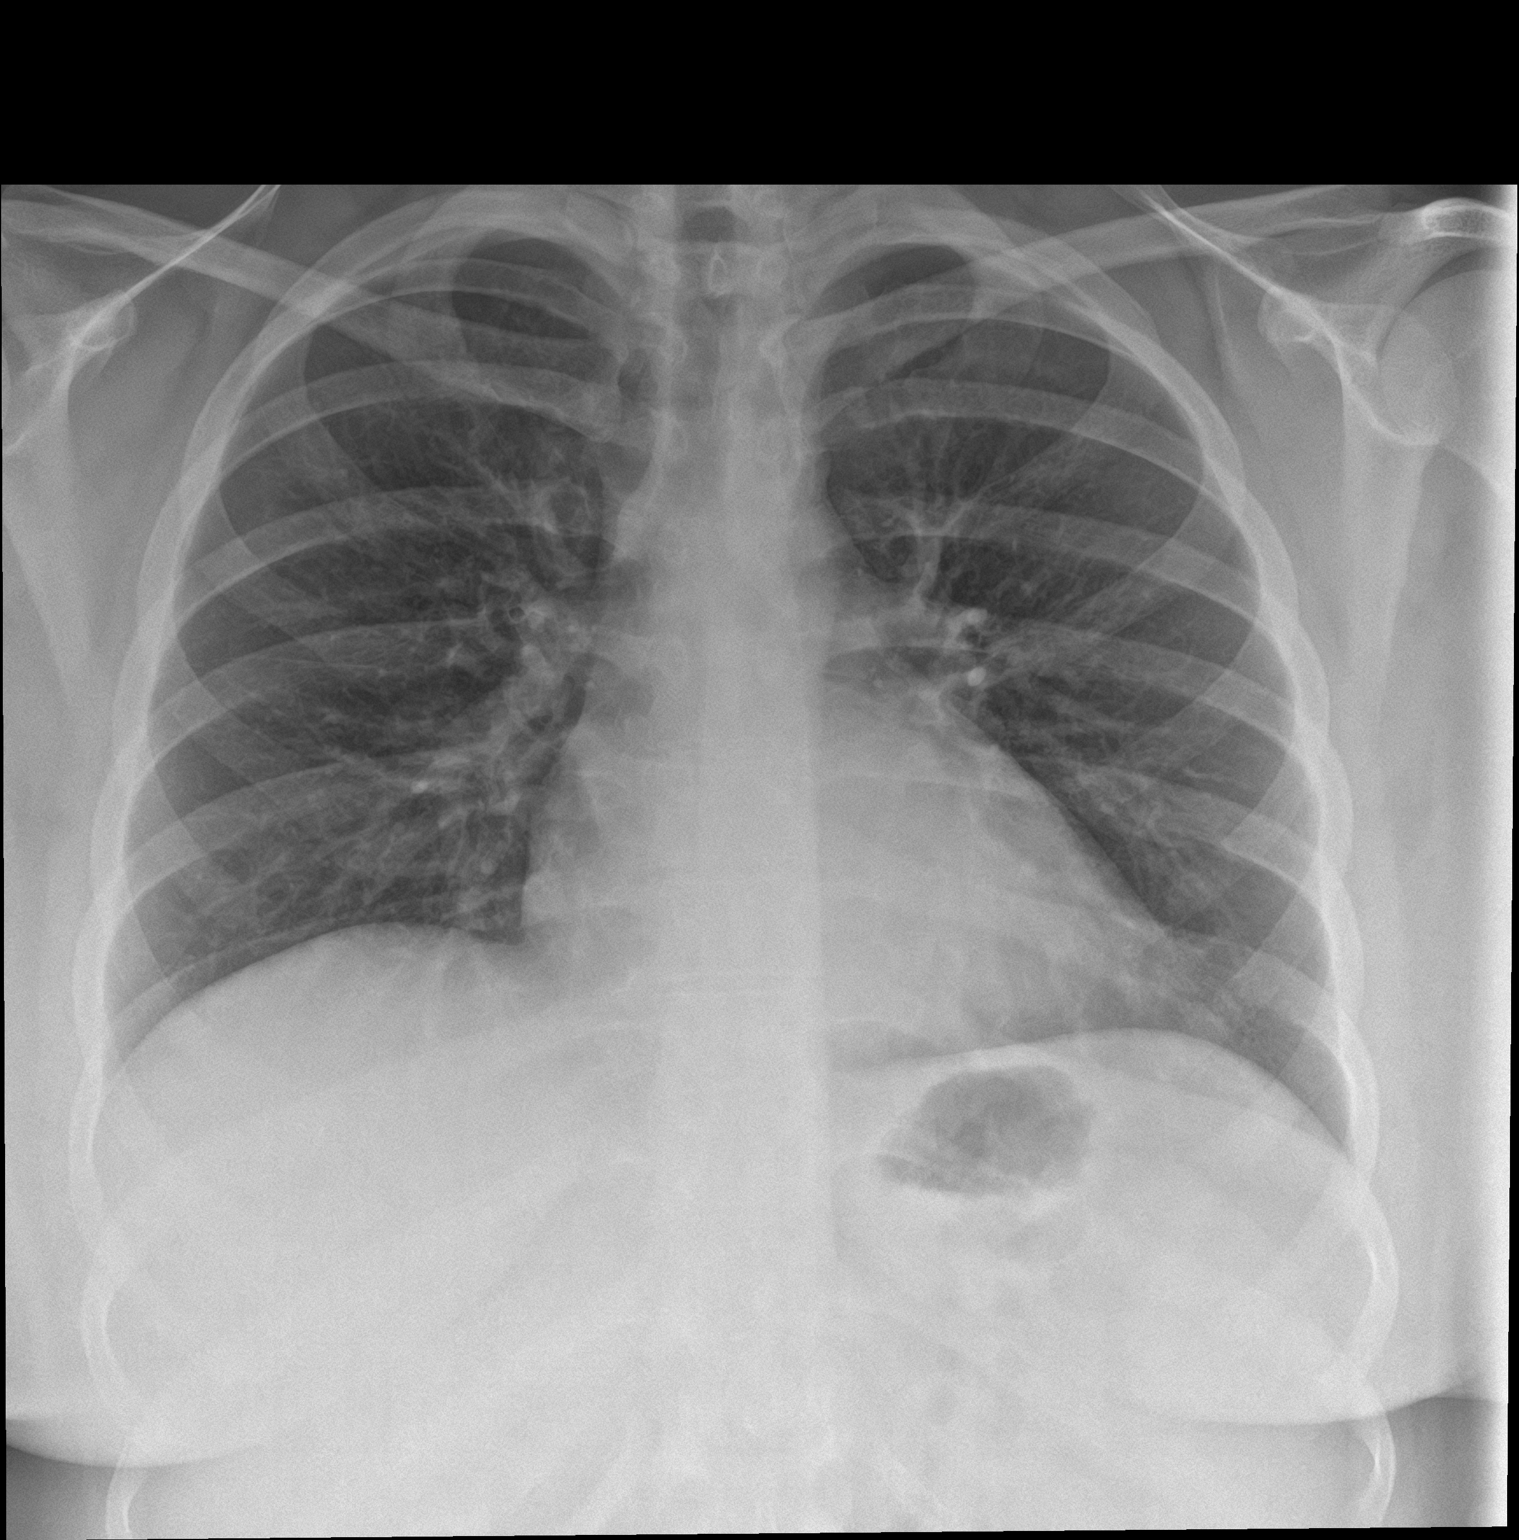

[chest lat]
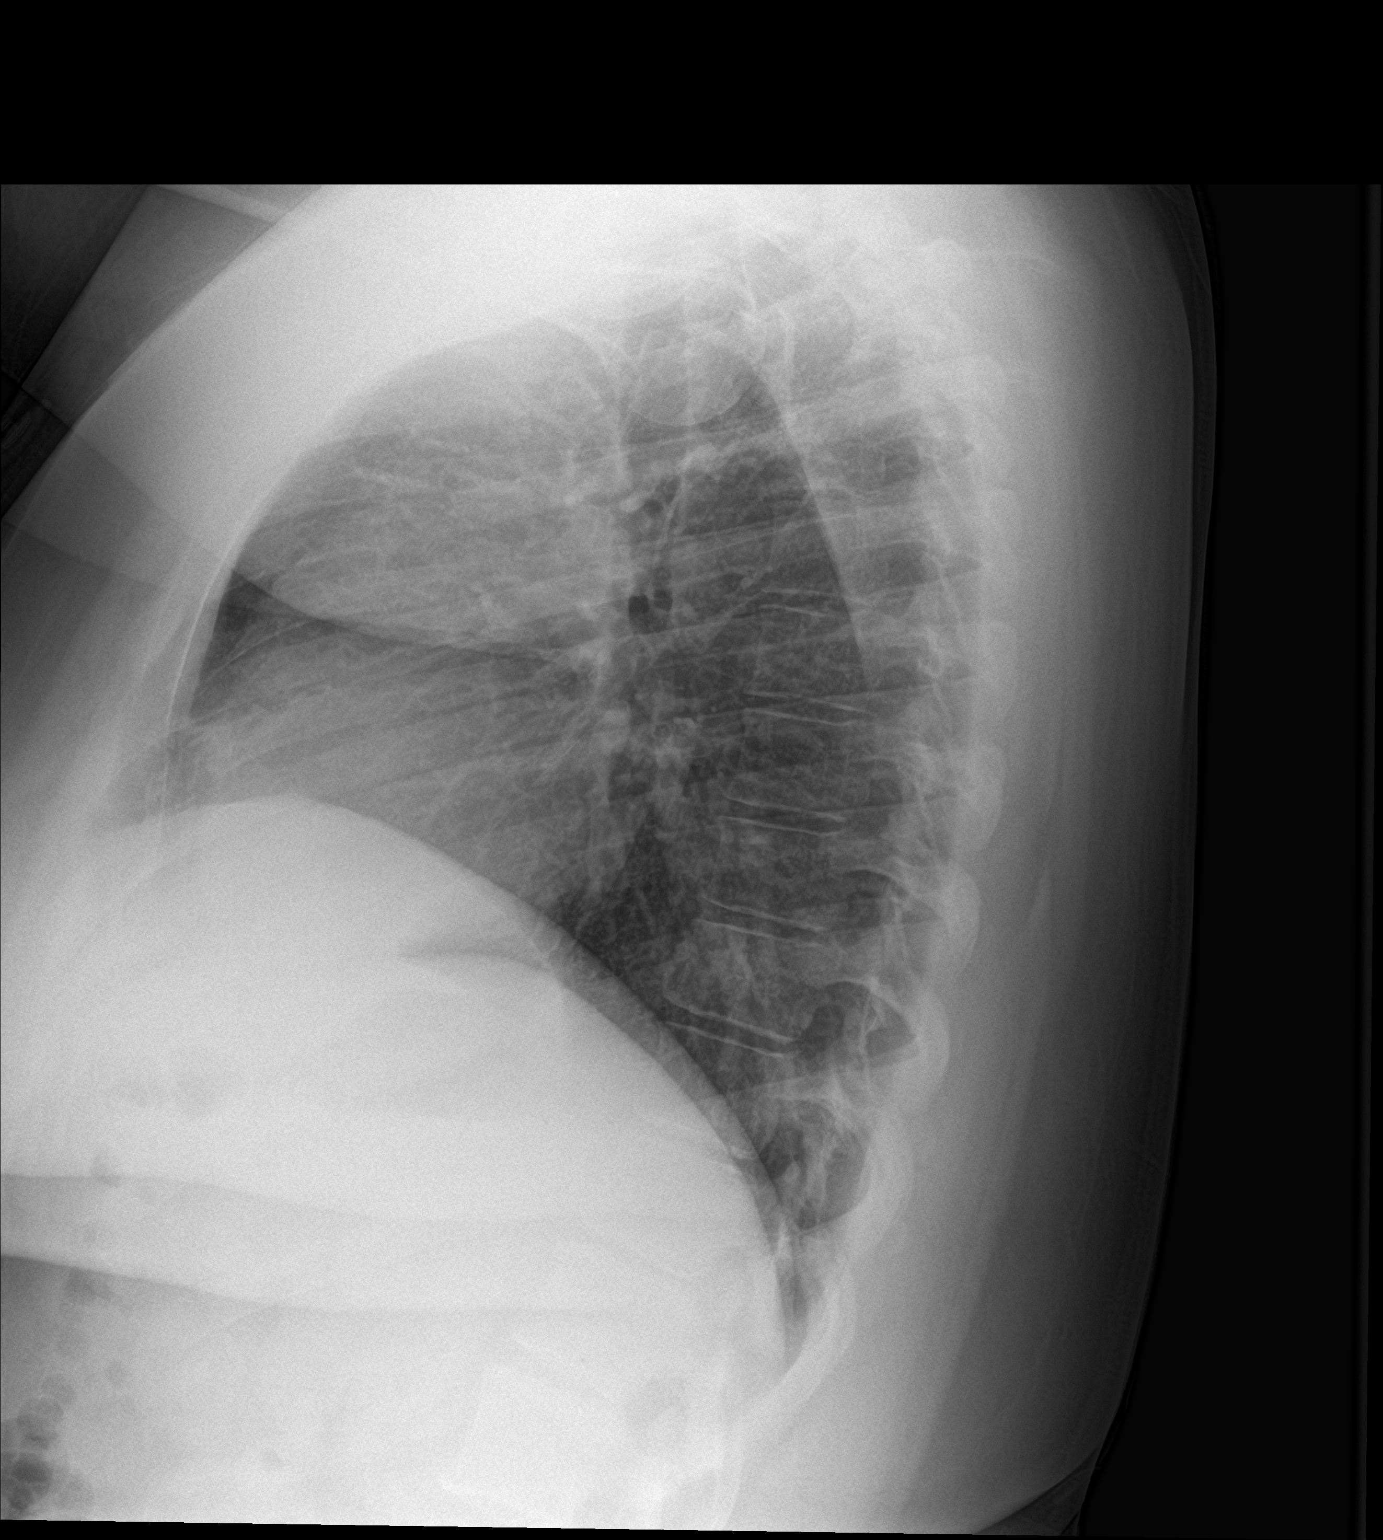

[2 of 2 positions shown; findings below may reference images not displayed]

FINDINGS: Mediastinum and hilar structures normal. Heart size normal. No focal
infiltrate. No pleural effusion or pneumothorax.
IMPRESSION: No acute cardiopulmonary disease.

## 2022-03-15 ENCOUNTER — Other Ambulatory Visit: Payer: Self-pay

## 2022-03-15 ENCOUNTER — Ambulatory Visit
Admission: EM | Admit: 2022-03-15 | Discharge: 2022-03-15 | Disposition: A | Discharge status: Home / Self Care | Payer: BC Managed Care – PPO | Attending: Nurse Practitioner | Admitting: Nurse Practitioner

## 2022-03-15 DIAGNOSIS — N939 Abnormal uterine and vaginal bleeding, unspecified: Secondary | ICD-10-CM

## 2022-03-15 DIAGNOSIS — R102 Pelvic and perineal pain: Secondary | ICD-10-CM

## 2022-03-15 MED ORDER — IBUPROFEN 800 MG PO TABS: 800.0000 mg | ORAL_TABLET | Freq: Three times a day (TID) | ORAL | 0 refills | Status: DC

## 2022-03-15 NOTE — ED Provider Notes (Signed)
[facility]    CSN: [id number] Arrival date & time: 03/15/22  1302      History   Chief Complaint Chief Complaint  Patient presents with   Abdominal Pain   Vaginal Bleeding    HPI Maureen Gutierrez is a 21 y.o. female.   HPI  She is in today for vaginal bleeding with abdominal pain for 3 days. She is having heavy bleeding. She has a significant history of issues related to her menstrual cycle. She reports that last cycle started on Sunday and ended Tuesday or Wednesday. She is also having headache. She is concern because she had a episode of dizziness with her headache. She reports that APAP is not effective. She has tried extra strength APAP without luck.  She reports that she wanting an [place] to evaluate her symptoms. She does not feel like the bleeding is her period. She has to do a lot of bending with work and this is causing more bleeding.' Denies vaginal discharge or dysuria.  Denies ulcers or lesions   Past Medical History:  Diagnosis Date   Eczema    Heavy periods    Ovarian cyst    left - [name]   Painful menstrual periods     Patient Active Problem List   Diagnosis Date Noted   Panic disorder 09/05/2020   Tobacco dependence 07/26/2019   Hidradenitis suppurativa 01/21/2019   Keratosis pilaris 02/16/2018   Menorrhagia with regular cycle 02/16/2018   IUD contraception    Left ovarian cyst 01/18/2017    Past Surgical History:  Procedure Laterality Date   TONSILLECTOMY AND ADENOIDECTOMY      OB History     Gravida  0   Para  0   Term  0   Preterm  0   AB  0   Living  0      SAB  0   IAB  0   Ectopic  0   Multiple  0   Live Births  0            Home Medications    Prior to Admission medications   Medication Sig Start Date End Date Taking? Authorizing Provider  citalopram (CELEXA) 10 MG tablet Take 1 tablet (10 mg total) by mouth daily. 09/05/20   Gabriel Cirri, NP  hydrOXYzine (ATARAX/VISTARIL) 25 MG  tablet Take 1 tablet (25 mg total) by mouth 3 (three) times daily as needed for anxiety. 08/01/20   Minna Antis, MD    Family History Family History  Problem Relation Age of Onset   Obesity Mother    Diabetes Father    Hypertension Father    ADD / ADHD Brother    Rheum arthritis Maternal Grandmother    Breast cancer Neg Hx    Ovarian cancer Neg Hx    Colon cancer Neg Hx     Social History Social History   Tobacco Use   Smoking status: Every Day    Packs/day: 0.50    Types: Cigarettes   Smokeless tobacco: Never  Vaping Use   Vaping Use: Former   Start date: 01/30/2018   Substances: Flavoring  Substance Use Topics   Alcohol use: Yes    Comment: rare   Drug use: No     Allergies   Patient has no known allergies.   Review of Systems Review of Systems   Physical Exam Triage Vital Signs ED Triage Vitals  Enc Vitals Group     BP 03/15/22 1524 122/84  Pulse Rate 03/15/22 1524 84     Resp 03/15/22 1524 18     Temp 03/15/22 1524 98 F (36.7 C)     Temp Source 03/15/22 1524 Oral     SpO2 03/15/22 1524 97 %     Weight --      Height --      Head Circumference --      Peak Flow --      Pain Score 03/15/22 1521 7     Pain Loc --      Pain Edu? --      Excl. in GC? --    No data found.  Updated Vital Signs BP 122/84 (BP Location: Right Arm)   Pulse 84   Temp 98 F (36.7 C) (Oral)   Resp 18   LMP 03/03/2022   SpO2 97%   Visual Acuity Right Eye Distance:   Left Eye Distance:   Bilateral Distance:    Right Eye Near:   Left Eye Near:    Bilateral Near:     Physical Exam Constitutional:      Appearance: She is obese.  HENT:     Head: Normocephalic.  Cardiovascular:     Rate and Rhythm: Normal rate and regular rhythm.  Pulmonary:     Effort: Pulmonary effort is normal.  Skin:    General: Skin is warm and dry.     Capillary Refill: Capillary refill takes less than 2 seconds.  Neurological:     General: No focal deficit present.      Mental Status: She is alert.      UC Treatments / Results  Labs (all labs ordered are listed, but only abnormal results are displayed) Labs Reviewed  URINALYSIS, ROUTINE W REFLEX MICROSCOPIC    EKG   Radiology No results found.  Procedures Procedures (including critical care time)  Medications Ordered in UC Medications - No data to display  Initial Impression / Assessment and Plan / UC Course  I have reviewed the triage vital signs and the nursing notes.  Pertinent labs & imaging results that were available during my care of the patient were reviewed by me and considered in my medical decision making (see chart for details).     Final Clinical Impressions(s) / UC Diagnoses   Final diagnoses:  Pelvic pain  Vaginal bleeding   Discharge Instructions   None    ED Prescriptions   None    PDMP not reviewed this encounter.   Thad Ranger [place], [place] 03/15/22 [phone]

## 2022-03-15 NOTE — Discharge Instructions (Addendum)
You have pelvic pain. You have been prescribed Ibuprofen 800 mg three times a day for pain.   Recommend that you follow up with your GYN office for further evaluation with an pelvic US.

## 2022-03-15 NOTE — ED Triage Notes (Addendum)
Pt is here with vaginal bleeding with abdominal pain that started 3 days ago, pt states when bends over she bleeding as well accompanied by a headache. Pt has taken Tylenol to relieve discomfort.

## 2022-03-19 ENCOUNTER — Ambulatory Visit: Payer: BC Managed Care – PPO | Admitting: Family Medicine

## 2022-03-19 ENCOUNTER — Telehealth: Payer: Self-pay

## 2022-03-19 ENCOUNTER — Telehealth: Payer: Self-pay | Admitting: *Deleted

## 2022-03-19 ENCOUNTER — Other Ambulatory Visit
Admission: RE | Admit: 2022-03-19 | Discharge: 2022-03-19 | Disposition: A | Discharge status: Home / Self Care | Payer: BC Managed Care – PPO | Source: Ambulatory Visit | Attending: Family Medicine | Admitting: Family Medicine

## 2022-03-19 ENCOUNTER — Ambulatory Visit: Payer: Self-pay | Admitting: *Deleted

## 2022-03-19 ENCOUNTER — Encounter: Payer: Self-pay | Admitting: Family Medicine

## 2022-03-19 VITALS — BP 114/72 | HR 97 | Temp 98.2°F | Resp 16 | Ht 69.0 in | Wt 250.2 lb

## 2022-03-19 DIAGNOSIS — Z716 Tobacco abuse counseling: Secondary | ICD-10-CM

## 2022-03-19 DIAGNOSIS — R109 Unspecified abdominal pain: Secondary | ICD-10-CM

## 2022-03-19 DIAGNOSIS — Z8742 Personal history of other diseases of the female genital tract: Secondary | ICD-10-CM

## 2022-03-19 DIAGNOSIS — N926 Irregular menstruation, unspecified: Secondary | ICD-10-CM

## 2022-03-19 DIAGNOSIS — N939 Abnormal uterine and vaginal bleeding, unspecified: Secondary | ICD-10-CM

## 2022-03-19 LAB — CBC WITH DIFFERENTIAL/PLATELET
Abs Immature Granulocytes: 0.05 10*3/uL (ref 0.00–0.07)
Basophils Absolute: 0.1 10*3/uL (ref 0.0–0.1)
Basophils Relative: 1 %
Eosinophils Absolute: 0.6 10*3/uL — ABNORMAL HIGH (ref 0.0–0.5)
Eosinophils Relative: 5 %
HCT: 41.4 % (ref 36.0–46.0)
Hemoglobin: 13.4 g/dL (ref 12.0–15.0)
Immature Granulocytes: 0 %
Lymphocytes Relative: 30 %
Lymphs Abs: 3.9 10*3/uL (ref 0.7–4.0)
MCH: 29.5 pg (ref 26.0–34.0)
MCHC: 32.4 g/dL (ref 30.0–36.0)
MCV: 91.2 fL (ref 80.0–100.0)
Monocytes Absolute: 0.6 10*3/uL (ref 0.1–1.0)
Monocytes Relative: 5 %
Neutro Abs: 8 10*3/uL — ABNORMAL HIGH (ref 1.7–7.7)
Neutrophils Relative %: 59 %
Platelets: 410 10*3/uL — ABNORMAL HIGH (ref 150–400)
RBC: 4.54 MIL/uL (ref 3.87–5.11)
RDW: 12.5 % (ref 11.5–15.5)
WBC: 13.2 10*3/uL — ABNORMAL HIGH (ref 4.0–10.5)
nRBC: 0 % (ref 0.0–0.2)

## 2022-03-19 LAB — POCT URINALYSIS DIPSTICK
Bilirubin, UA: NEGATIVE
Blood, UA: NEGATIVE
Glucose, UA: NEGATIVE
Ketones, UA: NEGATIVE
Leukocytes, UA: NEGATIVE
Nitrite, UA: NEGATIVE
Protein, UA: NEGATIVE
Spec Grav, UA: 1.02 (ref 1.010–1.025)
Urobilinogen, UA: 0.2 E.U./dL
pH, UA: 6 (ref 5.0–8.0)

## 2022-03-19 LAB — POCT URINE PREGNANCY: Preg Test, Ur: NEGATIVE

## 2022-03-19 MED ORDER — NICOTINE 14 MG/24HR TD PT24: 14.0000 mg | MEDICATED_PATCH | Freq: Every day | TRANSDERMAL | 1 refills | Status: DC

## 2022-03-19 MED ORDER — NICOTINE 21 MG/24HR TD PT24: 21.0000 mg | MEDICATED_PATCH | Freq: Every day | TRANSDERMAL | 1 refills | Status: DC

## 2022-03-19 NOTE — Progress Notes (Signed)
Patient ID: Maureen Gutierrez, female    DOB: 10-08-2000, 21 y.o.   MRN: [id number]  PCP: Maureen Cory, MD  Chief Complaint  Patient presents with   Ovarian Cyst    Ruptured today had sharp pain before it happened and has hx. Denis pain at the moment    Subjective:   Maureen Gutierrez is a 21 y.o. female, presents to clinic with CC of the following:  HPI  Pt presents for sudden onset sharp low abd/pelvic pain that occurred today while at work.  She believes it is a ruptured ovairan cyst. She just had her period - there have been ED/UC visits and calls to her GYN and she was recommended to go to the ED for evaluation She currently reports no pain, but she needs a work note She did have pain today, none currently, she is still having vag bleeding   LMP 03/01/2022 - menses lasted only 2-3 day of period, then on 12/6 she had sharp pain in her low stomach and started bleediing again which she states is vaginal bleeding but not her period - only bleeding if bending, lifting stretching The pain and bleeing on 12/6 she thinks its like her ovarian cysts like in the past 12/15 she went to UC cause she couldn't get ahold of her GYN and she went to UC and ibuprofen helped a little bit (for the past 4 d) at that time she had similar sx as well Today she had sudden onset sharp low pelvis pain - bleeding a little bit later  Sexually active with boyfriend, not on OCP, IUD removed 6 months - per chart it looks like it was removed May 2022  Nov menses she cannot recall  Pt having trouble explaining timing of symptoms. She does feel slightly weak in her legs, no current abd pain, denies any recent N/V bowel changes, no urinary sx Sexually active with one boyfriend she states trying to get pregnant She took a pregnancy test maybe a month ago       Patient Active Problem List   Diagnosis Date Noted   Panic disorder 09/05/2020   Tobacco dependence 07/26/2019   Hidradenitis suppurativa  01/21/2019   Keratosis pilaris 02/16/2018   Menorrhagia with regular cycle 02/16/2018   IUD contraception    Left ovarian cyst 01/18/2017      Current Outpatient Medications:    citalopram (CELEXA) 10 MG tablet, Take 1 tablet (10 mg total) by mouth daily., Disp: 30 tablet, Rfl: 3   hydrOXYzine (ATARAX/VISTARIL) 25 MG tablet, Take 1 tablet (25 mg total) by mouth 3 (three) times daily as needed for anxiety., Disp: 20 tablet, Rfl: 0   ibuprofen (ADVIL) 800 MG tablet, Take 1 tablet (800 mg total) by mouth 3 (three) times daily., Disp: 21 tablet, Rfl: 0   No Known Allergies   Social History   Tobacco Use   Smoking status: Every Day    Packs/day: 0.50    Types: Cigarettes   Smokeless tobacco: Never  Vaping Use   Vaping Use: Former   Start date: 01/30/2018   Substances: Flavoring  Substance Use Topics   Alcohol use: Yes    Comment: rare   Drug use: No      Chart Review Today: I personally reviewed active problem list, medication list, allergies, family history, social history, health maintenance, notes from last encounter, lab results, imaging with the patient/caregiver today.   Review of Systems  Constitutional: Negative.   HENT: Negative.    Eyes:  Negative.   Respiratory: Negative.    Cardiovascular: Negative.   Gastrointestinal: Negative.   Endocrine: Negative.   Genitourinary: Negative.   Musculoskeletal: Negative.   Skin: Negative.   Allergic/Immunologic: Negative.   Neurological: Negative.   Hematological: Negative.   Psychiatric/Behavioral: Negative.    All other systems reviewed and are negative.      Objective:   Vitals:   03/19/22 1535  BP: 114/72  Pulse: 97  Resp: 16  Temp: 98.2 F (36.8 C)  TempSrc: Oral  SpO2: 98%  Weight: 250 lb 3.2 oz (113.5 kg)  Height: 5\' 9"  (1.753 m)    Body mass index is 36.95 kg/m.  Physical Exam Vitals and nursing note reviewed.  Constitutional:      General: She is not in acute distress.    Appearance:  Normal appearance. She is well-developed. She is obese. She is not ill-appearing, toxic-appearing or diaphoretic.  HENT:     Head: Normocephalic and atraumatic.     Nose: Nose normal.  Eyes:     General:        Right eye: No discharge.        Left eye: No discharge.     Conjunctiva/sclera: Conjunctivae normal.  Neck:     Trachea: No tracheal deviation.  Cardiovascular:     Rate and Rhythm: Normal rate and regular rhythm.     Pulses: Normal pulses.     Heart sounds: Normal heart sounds.  Pulmonary:     Effort: Pulmonary effort is normal. No respiratory distress.     Breath sounds: Normal breath sounds. No stridor.  Abdominal:     General: Bowel sounds are normal. There is no distension.     Palpations: Abdomen is soft. There is no mass.     Tenderness: There is no abdominal tenderness. There is no right CVA tenderness, left CVA tenderness or guarding.     Hernia: No hernia is present.  Skin:    General: Skin is warm and dry.     Findings: No rash.  Neurological:     Mental Status: She is alert.     Motor: No abnormal muscle tone.     Coordination: Coordination normal.  Psychiatric:        Mood and Affect: Mood normal.        Behavior: Behavior normal.      Results for orders placed or performed during the hospital encounter of 08/01/20  Basic metabolic panel  Result Value Ref Range   Sodium 138 135 - 145 mmol/L   Potassium 3.8 3.5 - 5.1 mmol/L   Chloride 103 98 - 111 mmol/L   CO2 25 22 - 32 mmol/L   Glucose, Bld 105 (H) 70 - 99 mg/dL   BUN 6 6 - 20 mg/dL   Creatinine, Ser 10/01/20 0.44 - 1.00 mg/dL   Calcium 9.1 8.9 - 7.82 mg/dL   GFR, Estimated 95.6 >21 mL/min   Anion gap 10 5 - 15  CBC  Result Value Ref Range   WBC 13.5 (H) 4.0 - 10.5 K/uL   RBC 4.67 3.87 - 5.11 MIL/uL   Hemoglobin 14.2 12.0 - 15.0 g/dL   HCT >30 86.5 - 78.4 %   MCV 89.5 80.0 - 100.0 fL   MCH 30.4 26.0 - 34.0 pg   MCHC 34.0 30.0 - 36.0 g/dL   RDW 69.6 29.5 - 28.4 %   Platelets 328 150 - 400  K/uL   nRBC 0.0 0.0 - 0.2 %  D-dimer, quantitative  Result Value Ref Range   D-Dimer, Quant 0.51 (H) 0.00 - 0.50 ug/mL-FEU  TSH  Result Value Ref Range   TSH 2.940 0.350 - 4.500 uIU/mL  Troponin I (High Sensitivity)  Result Value Ref Range   Troponin I (High Sensitivity) 2 <18 ng/L  Troponin I (High Sensitivity)  Result Value Ref Range   Troponin I (High Sensitivity) <2 <18 ng/L       Assessment & Plan:     ICD-10-CM   1. Abdominal pain, unspecified abdominal location  R10.9 POCT urinalysis dipstick    CBC with Differential/Platelet    Beta HCG, Quant    CANCELED: CBC with Differential/Platelet    CANCELED: POCT urine pregnancy   no tenderness on exam - VSS, no N/V/D, UA neg, urine preg negative    2. Vaginal bleeding  N93.9 POCT urinalysis dipstick    CBC with Differential/Platelet    Beta HCG, Quant    POCT urine pregnancy    CANCELED: CBC with Differential/Platelet    CANCELED: POCT urine pregnancy   on and off over the past 2-3 weeks, cannot give hx of menstral cycles/patterns/timing or when IUD was removed, urine preg neg, quant HCG ordered    3. Irregular menses  N92.6 CBC with Differential/Platelet    Beta HCG, Quant    POCT urine pregnancy   IUD removed 18 months ago, irregular cycles, advised to keep log of menses - f/up GYN    4. History of ovarian cyst  Z87.42    only one documented in 2018 per [place], pain off and on with irregular vaginal bleeding she reports is cyts rupturing        I did explain to the patient the concerns that I have or her GYN would have when she calls in with reports of sudden onset severe abdominal pain and vaginal bleeding she does need to go get urgent imaging -but the concern is of ovarian torsion which I explained to her is dangerous and needs to be evaluated with an ultrasound usually in the emergency room setting Other things that can be dangerous are ectopic pregnancies -we did a urine pregnancy here which was negative we will  confirm that with a quantitative hCG.  If she continues to have irregular vaginal bleeding but does not have severe pain she can follow-up outpatient with her GYN  She was a is to go to the ER for emergent evaluation if she has recurrence of severe abdominal pain with vaginal bleeding, has worsening abdominal pain with nausea vomiting fever  I will be calling her with her blood levels and hCG test  A work note was given to her to excuse her from today -she is well-appearing with stable vital signs and grossly unremarkable physical exam I do not see any medical indications for any work restrictions at this point in time however I did explain to her that if there is any concerning findings with her blood work or if she go to the ER that could change. If she tends to have recurrence of pelvic pain and irregular menstrual cycles and this is severe enough to limit her ability to work she should also follow-up with her GYN to continue to do a workup or evaluation for this.  It does seem that if she is concerned with fertility and endometriosis.  Chart will be routed to her GYN provider   Danelle Berry, PA-C 03/19/22 3:46 PM

## 2022-03-19 NOTE — Telephone Encounter (Signed)
Pt calling; has questions about some things that are going on.  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 Had a VERY sharp pain today; went to the [facility] and blood just poured out of her; she now feels like she has noodles for legs and her hands are shaking.  Adv pt to go to ER; they will have her f/u with [place]; that way she will get in with [place] quicker.

## 2022-03-19 NOTE — Patient Instructions (Signed)
Go to the hospital if you have sudden worsening of abdominal/pelvic pain or bleeding you will need emergent imaging  Go to the hospital now for your labs  I strongly recommend you get a follow up appointment with your GYN office

## 2022-03-19 NOTE — Telephone Encounter (Signed)
Pt calling stating she had just missed call from practice at 5:00. States may have been regarding labs. Result note reviewed with pt, verbalizes understanding.       "Your labs show you are not anemic, which is good. There are some mild abnormalities with your platelets and white blood cells but you have had similar in the past and they aren't so abnormal that I'm worried. I'll send the other lab to you when I get it"

## 2022-03-19 NOTE — Progress Notes (Deleted)
Established Patient Office Visit  Name: Maureen Gutierrez   MRN: [id number]    DOB: 2000/04/10   Date:03/19/2022  Today's Provider: Jacquelin Hawking, MHS, PA-C Introduced myself to the patient as a PA-C and provided education on APPs in clinical practice.         Subjective  Chief Complaint  No chief complaint on file.   HPI   Patient Active Problem List   Diagnosis Date Noted   Panic disorder 09/05/2020   Tobacco dependence 07/26/2019   Hidradenitis suppurativa 01/21/2019   Keratosis pilaris 02/16/2018   Menorrhagia with regular cycle 02/16/2018   IUD contraception    Left ovarian cyst 01/18/2017    Past Surgical History:  Procedure Laterality Date   TONSILLECTOMY AND ADENOIDECTOMY      Family History  Problem Relation Age of Onset   Obesity Mother    Diabetes Father    Hypertension Father    ADD / ADHD Brother    Rheum arthritis Maternal Grandmother    Breast cancer Neg Hx    Ovarian cancer Neg Hx    Colon cancer Neg Hx     Social History   Tobacco Use   Smoking status: Every Day    Packs/day: 0.50    Types: Cigarettes   Smokeless tobacco: Never  Substance Use Topics   Alcohol use: Yes    Comment: rare     Current Outpatient Medications:    citalopram (CELEXA) 10 MG tablet, Take 1 tablet (10 mg total) by mouth daily., Disp: 30 tablet, Rfl: 3   hydrOXYzine (ATARAX/VISTARIL) 25 MG tablet, Take 1 tablet (25 mg total) by mouth 3 (three) times daily as needed for anxiety., Disp: 20 tablet, Rfl: 0   ibuprofen (ADVIL) 800 MG tablet, Take 1 tablet (800 mg total) by mouth 3 (three) times daily., Disp: 21 tablet, Rfl: 0  No Known Allergies  I personally reviewed {Reviewed:[id number]} with the patient/caregiver today.   ROS    Objective  There were no vitals filed for this visit.  There is no height or weight on file to calculate BMI.  Physical Exam   No results found for this or any previous visit (from the past 2160 hour(s)).   PHQ2/9:     09/05/2020    9:49 AM 07/03/2020    3:39 PM 07/26/2019    9:47 AM 02/16/2018    1:54 PM  Depression screen PHQ 2/9  Decreased Interest 0 0 0 0  Down, Depressed, Hopeless 1 0 0 0  PHQ - 2 Score 1 0 0 0  Altered sleeping 0  0 0  Tired, decreased energy 0  2 0  Change in appetite 0  0 0  Feeling bad or failure about yourself  0  0 0  Trouble concentrating 0  0 0  Moving slowly or fidgety/restless 0  0 0  Suicidal thoughts 0  0 0  PHQ-9 Score 1  2 0  Difficult doing work/chores Not difficult at all  Not difficult at all Not difficult at all      Fall Risk:    09/05/2020    9:46 AM 07/31/2020    9:21 AM 07/03/2020    3:39 PM 07/26/2019    9:47 AM 02/16/2018    1:54 PM  Fall Risk   Falls in the past year? 0 0 0 0 0  Number falls in past yr: 0 0 0 0   Injury with Fall? 0 0 0 0  Follow up Falls evaluation completed          Functional Status Survey:      Assessment & Plan

## 2022-03-19 NOTE — Telephone Encounter (Signed)
Opened chart to answer question for agent.  Pt calling in with history of ruptured ovarian cysts.   Had one rupture about 2 hours ago and now feeling weak and shaky after feeling a sharp pain in her abd. Maureen Gutierrez at [facility] had an appt. Today at 3:40.   Agent asked if pt could make it since it's 3:15 now.   Pt could make that appt. So no triage done so she could get to appt. On time.

## 2022-03-20 ENCOUNTER — Telehealth: Payer: Self-pay | Admitting: Family Medicine

## 2022-03-20 ENCOUNTER — Other Ambulatory Visit: Payer: Self-pay

## 2022-03-20 NOTE — Telephone Encounter (Signed)
Others labs not received yet or reviewed by provider

## 2022-03-20 NOTE — Telephone Encounter (Signed)
Copied from CRM [id number]. Topic: General - Other >> Mar 20, 2022  1:06 PM Dondra Prader E wrote: Reason for CRM: Please call patient back, she called to report that her job is not accepting the current doctor's note because they do not like the statement "It is my medical opinion that Kennedi Lizardo  was seen in our clinic today".Please advise, they want this revised to "sound more professional" it was she says she was instructed to say by her employer.   Best contact: [phone]

## 2022-03-20 NOTE — Telephone Encounter (Signed)
Spoke to pt and asked if there is something in specific her boss wanted for [place] to add to the note. Pt stated unsure of what he wanted re wrote it the best way it could and sent it through mychart.

## 2022-03-21 ENCOUNTER — Ambulatory Visit (INDEPENDENT_AMBULATORY_CARE_PROVIDER_SITE_OTHER): Payer: BC Managed Care – PPO | Admitting: Obstetrics and Gynecology

## 2022-03-21 ENCOUNTER — Encounter: Payer: Self-pay | Admitting: Obstetrics and Gynecology

## 2022-03-21 VITALS — Ht 68.0 in | Wt 252.0 lb

## 2022-03-21 DIAGNOSIS — N970 Female infertility associated with anovulation: Secondary | ICD-10-CM | POA: Diagnosis not present

## 2022-03-21 LAB — BETA HCG QUANT (REF LAB): hCG Quant: <1 m[IU]/mL

## 2022-03-21 NOTE — Progress Notes (Signed)
HPI:      [name] is a 21 y.o. G0P0000 who LMP was Patient's last menstrual period was 03/01/2022 (approximate).  Subjective:   She presents today because she is certain that she had an ovarian cyst that ruptured a few days ago.  She reports that she had pain for a while and that after it ruptured her pain is resolved.  She has not had pain in several days. In addition, she has been attempting pregnancy for about 10 months without resultant pregnancy.  She states that she has normal monthly cycles at about the same time but her bleeding can vary between 2 and 7 days.  Reports intercourse 2-3 times per week.  Is doing some preliminary cycle tracking on her phone.  She has a remote history of gonorrhea treated appropriately.    Hx: The following portions of the patient's history were reviewed and updated as appropriate:             She  has a past medical history of Eczema, Heavy periods, Ovarian cyst, and Painful menstrual periods. She does not have any pertinent problems on file. She  has a past surgical history that includes Tonsillectomy and adenoidectomy. Her family history includes ADD / ADHD in her brother; Diabetes in her father; Hypertension in her father; Obesity in her mother; Rheum arthritis in her maternal grandmother. She  reports that she has been smoking cigarettes. She has been smoking an average of .5 packs per day. She has never used smokeless tobacco. She reports current alcohol use. She reports that she does not use drugs. She currently has no medications in their medication list. She has No Known Allergies.       Review of Systems:  Review of Systems  Constitutional: Denied constitutional symptoms, night sweats, recent illness, fatigue, fever, insomnia and weight loss.  Eyes: Denied eye symptoms, eye pain, photophobia, vision change and visual disturbance.  Ears/Nose/Throat/Neck: Denied ear, nose, throat or neck symptoms, hearing loss, nasal discharge, sinus  congestion and sore throat.  Cardiovascular: Denied cardiovascular symptoms, arrhythmia, chest pain/pressure, edema, exercise intolerance, orthopnea and palpitations.  Respiratory: Denied pulmonary symptoms, asthma, pleuritic pain, productive sputum, cough, dyspnea and wheezing.  Gastrointestinal: Denied, gastro-esophageal reflux, melena, nausea and vomiting.  Genitourinary: Personally better for you for you worried about how it looks because I doubt if anyone Denied genitourinary symptoms including symptomatic vaginal discharge, pelvic relaxation issues, and urinary complaints.  Musculoskeletal: Denied musculoskeletal symptoms, stiffness, swelling, muscle weakness and myalgia.  Dermatologic: Denied dermatology symptoms, rash and scar.  Neurologic: Denied neurology symptoms, dizziness, headache, neck pain and syncope.  Psychiatric: Denied psychiatric symptoms, anxiety and depression.  Endocrine: Denied endocrine symptoms including hot flashes and night sweats.   Meds:   No current outpatient medications on file prior to visit.   No current facility-administered medications on file prior to visit.      Objective:     There were no vitals filed for this visit. Filed Weights   03/21/22 1056  Weight: 252 lb (114.3 kg)                        Assessment:    G0P0000 Patient Active Problem List   Diagnosis Date Noted   Panic disorder 09/05/2020   Tobacco dependence 07/26/2019   Hidradenitis suppurativa 01/21/2019   Keratosis pilaris 02/16/2018   Menorrhagia with regular cycle 02/16/2018   IUD contraception    Left ovarian cyst 01/18/2017     1.  Oligo-ovulation     Patient may or may not be ovulating monthly.  My inclination is to think that she is because of her irregular cycles.  10 months of attempting pregnancy without a pregnancy as close to infertility diagnosis.  No follow-up warranted or needed for possible ruptured ovarian cyst because her pain has resolved at this  time.   Plan:            1.  Recommend PCO labs.  Possible semen analysis in the near future.  Continue prenatal vitamins.  2.  Patient needs annual exam in the near future. Orders Orders Placed This Encounter  Procedures   DHEA-sulfate   FSH/LH   Glucose, fasting   TSH   Testosterone, Free, Total, SHBG   Prolactin   Insulin, random    No orders of the defined types were placed in this encounter.     F/U  Return in about 4 weeks (around 04/18/2022) for Annual Physical. I spent 30 minutes involved in the care of this patient preparing to see the patient by obtaining and reviewing her medical history (including labs, imaging tests and prior procedures), documenting clinical information in the electronic health record (EHR), counseling and coordinating care plans, writing and sending prescriptions, ordering tests or procedures and in direct communicating with the patient and medical staff discussing pertinent items from her history and physical exam.  Elonda Husky, M.D. 03/21/2022 11:45 AM

## 2022-03-21 NOTE — Progress Notes (Signed)
Patient here today for referral from PCP. She thinks she had an ovarian cyst. She has had them in the past. She had severe sharp pain in lower pelvic area. She went to urgent care and her PCP. Has not had an ultrasound. She is also trying to get pregnant.

## 2022-03-22 ENCOUNTER — Other Ambulatory Visit: Payer: BC Managed Care – PPO

## 2022-03-22 DIAGNOSIS — N970 Female infertility associated with anovulation: Secondary | ICD-10-CM | POA: Diagnosis not present

## 2022-03-29 ENCOUNTER — Encounter: Payer: Self-pay | Admitting: Obstetrics and Gynecology

## 2022-04-02 LAB — FSH/LH
FSH: 6.3 m[IU]/mL
LH: 17.1 m[IU]/mL

## 2022-04-02 LAB — TSH: TSH: 1.63 u[IU]/mL (ref 0.450–4.500)

## 2022-04-02 LAB — INSULIN, RANDOM: INSULIN: 22.3 u[IU]/mL (ref 2.6–24.9)

## 2022-04-02 LAB — TESTOSTERONE, FREE, TOTAL, SHBG
Sex Hormone Binding: 17 nmol/L — ABNORMAL LOW (ref 24.6–122.0)
Testosterone, Free: 5.5 pg/mL — ABNORMAL HIGH (ref 0.0–4.2)
Testosterone: 56 ng/dL (ref 13–71)

## 2022-04-02 LAB — GLUCOSE, FASTING: Glucose, Plasma: 92 mg/dL (ref 70–99)

## 2022-04-02 LAB — PROLACTIN: Prolactin: 17 ng/mL (ref 4.8–33.4)

## 2022-04-02 LAB — DHEA-SULFATE: DHEA-SO4: 570 ug/dL — ABNORMAL HIGH (ref 110.0–431.7)

## 2022-04-04 ENCOUNTER — Ambulatory Visit: Payer: BC Managed Care – PPO | Admitting: Obstetrics and Gynecology

## 2022-04-05 ENCOUNTER — Telehealth: Payer: Self-pay

## 2022-04-05 NOTE — Telephone Encounter (Signed)
Patient calling in inquire about PCO labs that were done. Please advise on any follow-up

## 2022-04-11 NOTE — Telephone Encounter (Signed)
Advised patient via mychart per in office discussion with Dr. Amalia Hailey

## 2022-04-24 ENCOUNTER — Telehealth: Payer: BC Managed Care – PPO | Admitting: Obstetrics and Gynecology

## 2022-04-26 ENCOUNTER — Ambulatory Visit: Payer: Self-pay

## 2022-05-01 ENCOUNTER — Telehealth (INDEPENDENT_AMBULATORY_CARE_PROVIDER_SITE_OTHER): Payer: BC Managed Care – PPO | Admitting: Obstetrics and Gynecology

## 2022-05-01 ENCOUNTER — Encounter: Payer: Self-pay | Admitting: Obstetrics and Gynecology

## 2022-05-01 DIAGNOSIS — E282 Polycystic ovarian syndrome: Secondary | ICD-10-CM

## 2022-05-01 MED ORDER — DESOGESTREL-ETHINYL ESTRADIOL 0.15-0.02/0.01 MG (21/5) PO TABS: 1.0000 | ORAL_TABLET | Freq: Every day | ORAL | 0 refills | Status: DC

## 2022-05-01 NOTE — Progress Notes (Signed)
Virtual Visit via Video Note  I connected with Maureen Gutierrez on 05/01/22 at  7:45 AM EST by video and verified that I was speaking with the correct person using two identifiers.    Maureen Gutierrez is a 22 y.o. G0P0000 who LMP was No LMP recorded. I discussed the limitations, risks, security and privacy concerns of performing an evaluation and management service by video and the availability of in person appointments. I also discussed with the patient that there may be a patient responsible charge related to this service. The patient expressed understanding and agreed to proceed.  Location of patient: Home  Patient gave explicit verbal consent for video visit:  YES  Location of provider:  [facility] office  Persons other than physician and patient involved in provider conference:  None   Subjective:   History of Present Illness:    Patient has been attempting pregnancy for more than a year without resultant pregnancy.  She has had PCO labs and presents today to discuss them.  Hx: The following portions of the patient's history were reviewed and updated as appropriate:             She  has a past medical history of Eczema, Heavy periods, Ovarian cyst, and Painful menstrual periods. She does not have any pertinent problems on file. She  has a past surgical history that includes Tonsillectomy and adenoidectomy. Her family history includes ADD / ADHD in her brother; Diabetes in her father; Hypertension in her father; Obesity in her mother; Rheum arthritis in her maternal grandmother. She  reports that she has been smoking cigarettes. She has been smoking an average of .5 packs per day. She has never used smokeless tobacco. She reports current alcohol use. She reports that she does not use drugs. She has a current medication list which includes the following prescription(s): desogestrel-ethinyl estradiol. She has No Known Allergies.       Review of Systems:  Review of  Systems  Constitutional: Denied constitutional symptoms, night sweats, recent illness, fatigue, fever, insomnia and weight loss.  Eyes: Denied eye symptoms, eye pain, photophobia, vision change and visual disturbance.  Ears/Nose/Throat/Neck: Denied ear, nose, throat or neck symptoms, hearing loss, nasal discharge, sinus congestion and sore throat.  Cardiovascular: Denied cardiovascular symptoms, arrhythmia, chest pain/pressure, edema, exercise intolerance, orthopnea and palpitations.  Respiratory: Denied pulmonary symptoms, asthma, pleuritic pain, productive sputum, cough, dyspnea and wheezing.  Gastrointestinal: Denied, gastro-esophageal reflux, melena, nausea and vomiting.  Genitourinary: Denied genitourinary symptoms including symptomatic vaginal discharge, pelvic relaxation issues, and urinary complaints.  Musculoskeletal: Denied musculoskeletal symptoms, stiffness, swelling, muscle weakness and myalgia.  Dermatologic: Denied dermatology symptoms, rash and scar.  Neurologic: Denied neurology symptoms, dizziness, headache, neck pain and syncope.  Psychiatric: Denied psychiatric symptoms, anxiety and depression.  Endocrine: Denied endocrine symptoms including hot flashes and night sweats.   Meds:   No current outpatient medications on file prior to visit.   No current facility-administered medications on file prior to visit.    Assessment:    G0P0000 Patient Active Problem List   Diagnosis Date Noted   Panic disorder 09/05/2020   Tobacco dependence 07/26/2019   Hidradenitis suppurativa 01/21/2019   Keratosis pilaris 02/16/2018   Menorrhagia with regular cycle 02/16/2018   IUD contraception    Left ovarian cyst 01/18/2017     1. PCO (polycystic ovaries)     Multiple soft signs of PCO including insulin resistance, elevated free testosterone reversed LH FSH ratio.  Plan:  1.  Recommend OCPs for 3 months.   OCPs The risks /benefits of OCPs have been explained to  the patient in detail.  Product literature has been given to her where appropriate.  I have instructed her in the use of OCPs.  I have explained to the patient that OCPs are not as effective for birth control during the first month of use, and that another form of contraception should be used during this time.  Both first-day start and Sunday start have been explained.  The risks and benefits of each was discussed.  She has been made aware of  the fact that in rare circumstances, other medications may affect the efficacy of OCPs.  I have answered all of her questions, and I believe that she has an understanding of the effectiveness and use of OCPs.  Will start Mircette   2.  Follow-up appointment in 10 weeks      Ovulation predictor kits, timed intercourse, prenatal vitamins, semen analysis.  Orders No orders of the defined types were placed in this encounter.    Meds ordered this encounter  Medications   desogestrel-ethinyl estradiol (MIRCETTE) 0.15-0.02/0.01 MG (21/5) tablet    Sig: Take 1 tablet by mouth at bedtime.    Dispense:  84 tablet    Refill:  0      F/U  Return in about 10 weeks (around 07/10/2022). I spent 21  minutes involved in the care of this patient preparing to see the patient by obtaining and reviewing her medical history (including labs, imaging tests and prior procedures), documenting clinical information in the electronic health record (EHR), counseling and coordinating care plans, writing and sending prescriptions, ordering tests or procedures and in direct communicating with the patient and medical staff discussing pertinent items from her history and physical exam.   Finis Bud, M.D. 05/01/2022 8:12 AM

## 2022-05-03 NOTE — Progress Notes (Unsigned)
Name: Maureen Gutierrez   MRN: [id number]    DOB: 2001/02/24   Date:05/06/2022       Progress Note  Subjective  Chief Complaint  Annual Exam  HPI  Patient presents for annual CPE.  Diet: she lives with her grandmother , eats at home but does not like vegetables. She eats some fruit  Exercise:  discussed 150 minutes per week.  Last Eye Exam: not up to date  Last Dental Exam: up to date   [facility] Visit from 05/06/2022 in [facility]  AUDIT-C Score 1      Depression: Phq 9 is  negative    05/06/2022    9:51 AM 03/19/2022    3:34 PM 09/05/2020    9:49 AM 07/03/2020    3:39 PM 07/26/2019    9:47 AM  Depression screen PHQ 2/9  Decreased Interest 0 0 0 0 0  Down, Depressed, Hopeless 0 0 1 0 0  PHQ - 2 Score 0 0 1 0 0  Altered sleeping 0 0 0  0  Tired, decreased energy 0 0 0  2  Change in appetite 0 0 0  0  Feeling bad or failure about yourself  0 0 0  0  Trouble concentrating 0 0 0  0  Moving slowly or fidgety/restless 0 0 0  0  Suicidal thoughts 0 0 0  0  PHQ-9 Score 0 0 1  2  Difficult doing work/chores  Not difficult at all Not difficult at all  Not difficult at all      05/06/2022    9:51 AM 09/05/2020    9:50 AM 02/16/2018    1:55 PM  GAD 7 : Generalized Anxiety Score  Nervous, Anxious, on Edge 0 1 0  Control/stop worrying 0 1 0  Worry too much - different things 0 1 0  Trouble relaxing 0 0 0  Restless 0 0 0  Easily annoyed or irritable 0 1 0  Afraid - awful might happen 0 0 0  Total GAD 7 Score 0 4 0  Anxiety Difficulty  Somewhat difficult Not difficult at all     Hypertension: BP Readings from Last 3 Encounters:  05/06/22 116/72  03/19/22 114/72  03/15/22 122/84   Obesity: Wt Readings from Last 3 Encounters:  05/06/22 242 lb 12.8 oz (110.1 kg)  03/21/22 252 lb (114.3 kg)  03/19/22 250 lb 3.2 oz (113.5 kg)   BMI Readings from Last 3 Encounters:  05/06/22 36.92 kg/m  03/21/22 38.32 kg/m  03/19/22 36.95 kg/m      Vaccines:   HPV: up to date Tdap: 2012, due Shingrix: N/A Pneumonia: N/A Flu: up to date COVID-19: up to date   Hep C Screening: N/A STD testing and prevention (HIV/chl/gon/syphilis): today  Intimate partner violence: negative screen  Sexual History :LMP 04/11/2021  Menstrual History/LMP/Abnormal Bleeding: regular cycles, under the care of Dr. Amalia Hailey and will start ocp for 3 months to regular cycles prior to getting pregnant  Discussed importance of follow up if any post-menopausal bleeding: not applicable  Incontinence Symptoms: negative for symptoms   Breast cancer:  - Last Mammogram: N/A - BRCA gene screening: N/A  Osteoporosis Prevention : Discussed high calcium and vitamin D supplementation, weight bearing exercises Bone density: N/A   Cervical cancer screening: Never done, sees OB/GYN and states she will get it done at his office   Skin cancer: Discussed monitoring for atypical lesions  Colorectal cancer: N/A   Lung cancer:  Low Dose CT Chest recommended if Age 60-80 years, 20 pack-year currently smoking OR have quit w/in 15years. Patient does not qualify for screen   ECG: 08/01/20  Advanced Care Planning: A voluntary discussion about advance care planning including the explanation and discussion of advance directives.  Discussed health care proxy and Living will, and the patient was able to identify a health care proxy as mother .  Patient does not have a living will and power of attorney of health care    Glucose: Glucose  Date Value Ref Range Status  11/08/2016 99 65 - 99 mg/dL Final  16/12/9602 95 65 - 99 mg/dL Final   Glucose, Bld  Date Value Ref Range Status  08/01/2020 105 (H) 70 - 99 mg/dL Final    Comment:    Glucose reference range applies only to samples taken after fasting for at least 8 hours.    Patient Active Problem List   Diagnosis Date Noted   Panic disorder 09/05/2020   Tobacco dependence 07/26/2019   Hidradenitis suppurativa 01/21/2019    Keratosis pilaris 02/16/2018   Menorrhagia with regular cycle 02/16/2018   Left ovarian cyst 01/18/2017    Past Surgical History:  Procedure Laterality Date   TONSILLECTOMY AND ADENOIDECTOMY      Family History  Problem Relation Age of Onset   Obesity Mother    Diabetes Father    Hypertension Father    ADD / ADHD Brother    Rheum arthritis Maternal Grandmother    Breast cancer Neg Hx    Ovarian cancer Neg Hx    Colon cancer Neg Hx     Social History   Socioeconomic History   Marital status: Single    Spouse name: Not on file   Number of children: 0   Years of education: Not on file   Highest education level: High school graduate  Occupational History   Occupation: Server    Comment: [employer]   Occupation: Conservation officer, nature    Comment: [employer]   Occupation: Hosts    Comment: [employer]  Tobacco Use   Smoking status: Every Day    Packs/day: 0.50    Types: Cigarettes   Smokeless tobacco: Never  Vaping Use   Vaping Use: Former   Start date: 01/30/2018   Substances: Flavoring  Substance and Sexual Activity   Alcohol use: Yes    Comment: rare   Drug use: No   Sexual activity: Yes    Partners: Male    Birth control/protection: None  Other Topics Concern   Not on file  Social History Narrative   Works 6 days a week at [employer] to [employer] and going to [employer] to get a 2 year college degree   Social Determinants of Corporate investment banker Strain: Low Risk  (02/16/2018)   Overall Financial Resource Strain (CARDIA)    Difficulty of Paying Living Expenses: Not hard at all  Food Insecurity: No Food Insecurity (02/16/2018)   Hunger Vital Sign    Worried About Running Out of Food in the Last Year: Never true    Ran Out of Food in the Last Year: Never true  Transportation Needs: No Transportation Needs (02/16/2018)   PRAPARE - Administrator, Civil Service (Medical): No    Lack of Transportation (Non-Medical): No  Physical Activity: Inactive (02/16/2018)    Exercise Vital Sign    Days of Exercise per Week: 0 days    Minutes of Exercise per Session: 0  min  Stress: No Stress Concern Present (02/16/2018)   Barry    Feeling of Stress : Only a little  Social Connections: Moderately Integrated (02/16/2018)   Social Connection and Isolation Panel [NHANES]    Frequency of Communication with Friends and Family: More than three times a week    Frequency of Social Gatherings with Friends and Family: More than three times a week    Attends Religious Services: More than 4 times per year    Active Member of [employer] or Organizations: Yes    Attends Archivist Meetings: More than 4 times per year    Marital Status: Never married  Intimate Partner Violence: Not At Risk (02/16/2018)   Humiliation, Afraid, Rape, and Kick questionnaire    Fear of Current or Ex-Partner: No    Emotionally Abused: No    Physically Abused: No    Sexually Abused: No     Current Outpatient Medications:    desogestrel-ethinyl estradiol (MIRCETTE) 0.15-0.02/0.01 MG (21/5) tablet, Take 1 tablet by mouth at bedtime. (Patient not taking: Reported on 05/06/2022), Disp: 84 tablet, Rfl: 0  No Known Allergies   ROS  Constitutional: Negative for fever , positive for weight change.  Respiratory: Negative for cough and shortness of breath.   Cardiovascular: Negative for chest pain or palpitations.  Gastrointestinal: Negative for abdominal pain, no bowel changes.  Musculoskeletal: Negative for gait problem or joint swelling.  Skin: Negative for rash.  Neurological: Negative for dizziness or headache.  No other specific complaints in a complete review of systems (except as listed in HPI above).   Objective  Vitals:   05/06/22 0952  BP: 116/72  Pulse: 98  Resp: 16  Temp: 97.9 F (36.6 C)  TempSrc: Oral  SpO2: 100%  Weight: 242 lb 12.8 oz (110.1 kg)  Height: 5\' 8"  (1.727 m)    Body mass index is  36.92 kg/m.  Physical Exam  Constitutional: Patient appears well-developed and well-nourished. No distress.  HENT: Head: Normocephalic and atraumatic. Ears: B TMs ok, no erythema or effusion; Nose: Nose normal. Mouth/Throat: Oropharynx is clear and moist. No oropharyngeal exudate.  Eyes: Conjunctivae and EOM are normal. Pupils are equal, round, and reactive to light. No scleral icterus.  Neck: Normal range of motion. Neck supple. No JVD present. No thyromegaly present.  Cardiovascular: Normal rate, regular rhythm and normal heart sounds.  No murmur heard. No BLE edema. Pulmonary/Chest: Effort normal and breath sounds normal. No respiratory distress. Abdominal: Soft. Bowel sounds are normal, no distension. There is no tenderness. no masses Breast: no lumps or masses, no nipple discharge or rashes FEMALE GENITALIA:  Not done  RECTAL: not done  Musculoskeletal: Normal range of motion, no joint effusions. No gross deformities Neurological: he is alert and oriented to person, place, and time. No cranial nerve deficit. Coordination, balance, strength, speech and gait are normal.  Skin: Skin is warm and dry. Scar from old abscess under right breast, she may have hidradenitis suppurative  Psychiatric: Patient has a normal mood and affect. behavior is normal. Judgment and thought content normal.   Recent Results (from the past 2160 hour(s))  POCT urinalysis dipstick     Status: None   Collection Time: 03/19/22  4:13 PM  Result Value Ref Range   Color, UA yellow    Clarity, UA cloudy    Glucose, UA Negative Negative   Bilirubin, UA Negative    Ketones, UA Negative    Spec Grav,  UA 1.020 1.010 - 1.025   Blood, UA Negative    pH, UA 6.0 5.0 - 8.0   Protein, UA Negative Negative   Urobilinogen, UA 0.2 0.2 or 1.0 E.U./dL   Nitrite, UA Negative    Leukocytes, UA Negative Negative   Appearance Yellow    Odor None   POCT urine pregnancy     Status: None   Collection Time: 03/19/22  4:19 PM   Result Value Ref Range   Preg Test, Ur Negative Negative  Beta HCG, Quant     Status: None   Collection Time: 03/19/22  4:59 PM  Result Value Ref Range   hCG Quant <1 mIU/mL    Comment: (NOTE)                     Female (Non-pregnant)    0 -     5                            (Postmenopausal)  0 -     8                     Female (Pregnant)                     Weeks of Gestation                             3                6 -    71                             4               10 -   750                             5              217 -  7138                             6              158 - 31795                             7             [id number]                             8            [id number]                             9            [id number]                            10            [id number]  [place] dology Performed At: [facility] [place], [place] [id number] Rush Farmer MD UK:[id number]   CBC with Differential/Platelet     Status: Abnormal   Collection Time: 03/19/22  4:59 PM  Result Value Ref Range   WBC 13.2 (H) 4.0 - 10.5 K/uL   RBC 4.54 3.87 - 5.11 MIL/uL   Hemoglobin 13.4 12.0 - 15.0 g/dL   HCT 41.4 36.0 - 46.0 %   MCV 91.2 80.0 - 100.0 fL   MCH 29.5 26.0 - 34.0 pg   MCHC 32.4 30.0 - 36.0 g/dL   RDW 12.5 11.5 - 15.5 %   Platelets 410 (H) 150 - 400 K/uL   nRBC 0.0 0.0 - 0.2 %   Neutrophils Relative % 59 %   Neutro Abs 8.0 (H) 1.7 - 7.7 K/uL   Lymphocytes Relative 30 %   Lymphs Abs 3.9 0.7 - 4.0 K/uL   Monocytes Relative 5 %   Monocytes Absolute 0.6 0.1 - 1.0 K/uL   Eosinophils  Relative 5 %   Eosinophils Absolute 0.6 (H) 0.0 - 0.5 K/uL   Basophils Relative 1 %   Basophils Absolute 0.1 0.0 - 0.1 K/uL   Immature Granulocytes 0 %   Abs Immature Granulocytes 0.05 0.00 - 0.07 K/uL    Comment: Performed at [facility], [address] [place], [place] [address]  DHEA-sulfate     Status: Abnormal   Collection Time: 03/22/22  9:19 AM  Result Value Ref Range   DHEA-SO4 570.0 (H) 110.0 - 431.7 ug/dL  FSH/LH     Status: None   Collection Time: 03/22/22  9:19 AM  Result Value Ref Range   LH 17.1 mIU/mL    Comment:                      Adult Female              Range                       Follicular phase      2.4 -  12.6  Ovulation phase      14.0 -  95.6                       Luteal phase          1.0 -  11.4                       Postmenopausal        7.7 -  58.5    FSH 6.3 mIU/mL    Comment:                      Adult Female             Range                       Follicular phase      3.5 -  12.5                       Ovulation phase       4.7 -  21.5                       Luteal phase          1.7 -   7.7                       Postmenopausal       25.8 - 134.8   Glucose, fasting     Status: None   Collection Time: 03/22/22  9:19 AM  Result Value Ref Range   Glucose, Plasma 92 70 - 99 mg/dL    Comment:                         Please Note:                          Prediabetes        100 - 125                          Diabetes                >125   TSH     Status: None   Collection Time: 03/22/22  9:19 AM  Result Value Ref Range   TSH 1.630 0.450 - 4.500 uIU/mL  Testosterone, Free, Total, SHBG     Status: Abnormal   Collection Time: 03/22/22  9:19 AM  Result Value Ref Range   Testosterone 56 13 - 71 ng/dL   Testosterone, Free 5.5 (H) 0.0 - 4.2 pg/mL   Sex Hormone Binding 17.0 (L) 24.6 - 122.0 nmol/L  Prolactin     Status: None   Collection Time: 03/22/22  9:19 AM  Result Value Ref Range   Prolactin 17.0 4.8 - 33.4 ng/mL     Comment:               **Please note reference interval change**  Insulin, random     Status: None   Collection Time: 03/22/22  9:19 AM  Result Value Ref Range   INSULIN 22.3 2.6 - 24.9 uIU/mL     Fall Risk:    05/06/2022    9:50 AM 03/19/2022    3:34 PM 09/05/2020    9:46 AM 07/31/2020  9:21 AM 07/03/2020    3:39 PM  Fall Risk   Falls in the past year? 0 0 0 0 0  Number falls in past yr:  0 0 0 0  Injury with Fall?  0 0 0 0  Risk for fall due to : No Fall Risks No Fall Risks     Follow up Falls prevention discussed Falls prevention discussed;Education provided;Falls evaluation completed Falls evaluation completed       Functional Status Survey: Is the patient deaf or have difficulty hearing?: No Does the patient have difficulty seeing, even when wearing glasses/contacts?: No Does the patient have difficulty concentrating, remembering, or making decisions?: No Does the patient have difficulty walking or climbing stairs?: No Does the patient have difficulty dressing or bathing?: No Does the patient have difficulty doing errands alone such as visiting a doctor's office or shopping?: No   Assessment & Plan  1. Well adult exam  - HIV antibody (with reflex) - Hepatitis C Antibody - RPR - Lipid panel - Cervicovaginal ancillary only - CBC with Differential/Platelet - Tdap vaccine greater than or equal to 7yo IM  2. Screen for STD (sexually transmitted disease)  - HIV antibody (with reflex)  3. Need for hepatitis C screening test  - Hepatitis C Antibody  4. Leukocytosis, unspecified type  - CBC with Differential/Platelet She has recurrent boils - usually in areas she sweats - may be hydradenitis   5. Routine screening for STI (sexually transmitted infection)  - RPR - Cervicovaginal ancillary only  6. Lipid screening  - Lipid panel  7. Need for Tdap vaccination  - Tdap vaccine greater than or equal to 7yo IM     -USPSTF grade A and B recommendations  reviewed with patient; age-appropriate recommendations, preventive care, screening tests, etc discussed and encouraged; healthy living encouraged; see AVS for patient education given to patient -Discussed importance of 150 minutes of physical activity weekly, eat two servings of fish weekly, eat one serving of tree nuts ( cashews, pistachios, pecans, almonds.Marland Kitchen) every other day, eat 6 servings of fruit/vegetables daily and drink plenty of water and avoid sweet beverages.   -Reviewed Health Maintenance: Yes.

## 2022-05-03 NOTE — Patient Instructions (Incomplete)

## 2022-05-06 ENCOUNTER — Encounter: Payer: Self-pay | Admitting: Family Medicine

## 2022-05-06 ENCOUNTER — Ambulatory Visit (INDEPENDENT_AMBULATORY_CARE_PROVIDER_SITE_OTHER): Payer: BC Managed Care – PPO | Admitting: Family Medicine

## 2022-05-06 ENCOUNTER — Other Ambulatory Visit (HOSPITAL_COMMUNITY)
Admission: RE | Admit: 2022-05-06 | Discharge: 2022-05-06 | Disposition: A | Discharge status: Home / Self Care | Payer: BC Managed Care – PPO | Source: Ambulatory Visit | Attending: Family Medicine | Admitting: Family Medicine

## 2022-05-06 VITALS — BP 116/72 | HR 98 | Temp 97.9°F | Resp 16 | Ht 68.0 in | Wt 242.8 lb

## 2022-05-06 DIAGNOSIS — Z113 Encounter for screening for infections with a predominantly sexual mode of transmission: Secondary | ICD-10-CM | POA: Diagnosis not present

## 2022-05-06 DIAGNOSIS — Z1159 Encounter for screening for other viral diseases: Secondary | ICD-10-CM | POA: Diagnosis not present

## 2022-05-06 DIAGNOSIS — Z0000 Encounter for general adult medical examination without abnormal findings: Secondary | ICD-10-CM | POA: Insufficient documentation

## 2022-05-06 DIAGNOSIS — D72829 Elevated white blood cell count, unspecified: Secondary | ICD-10-CM

## 2022-05-06 DIAGNOSIS — Z23 Encounter for immunization: Secondary | ICD-10-CM

## 2022-05-06 DIAGNOSIS — Z13220 Encounter for screening for lipoid disorders: Secondary | ICD-10-CM

## 2022-05-07 ENCOUNTER — Encounter: Payer: Self-pay | Admitting: Family Medicine

## 2022-05-07 LAB — CBC WITH DIFFERENTIAL/PLATELET
Absolute Monocytes: 801 cells/uL (ref 200–950)
Basophils Absolute: 86 cells/uL (ref 0–200)
Basophils Relative: 0.6 %
Eosinophils Absolute: 472 cells/uL (ref 15–500)
Eosinophils Relative: 3.3 %
HCT: 38.2 % (ref 35.0–45.0)
Hemoglobin: 13.1 g/dL (ref 11.7–15.5)
Lymphs Abs: 4033 cells/uL — ABNORMAL HIGH (ref 850–3900)
MCH: 30 pg (ref 27.0–33.0)
MCHC: 34.3 g/dL (ref 32.0–36.0)
MCV: 87.6 fL (ref 80.0–100.0)
MPV: 10.3 fL (ref 7.5–12.5)
Monocytes Relative: 5.6 %
Neutro Abs: 8909 cells/uL — ABNORMAL HIGH (ref 1500–7800)
Neutrophils Relative %: 62.3 %
Platelets: 394 10*3/uL (ref 140–400)
RBC: 4.36 10*6/uL (ref 3.80–5.10)
RDW: 11.6 % (ref 11.0–15.0)
Total Lymphocyte: 28.2 %
WBC: 14.3 10*3/uL — ABNORMAL HIGH (ref 3.8–10.8)

## 2022-05-07 LAB — CERVICOVAGINAL ANCILLARY ONLY
Chlamydia: NEGATIVE
Comment: NEGATIVE
Comment: NEGATIVE
Comment: NORMAL
Neisseria Gonorrhea: NEGATIVE
Trichomonas: NEGATIVE

## 2022-05-07 LAB — HEPATITIS C ANTIBODY: Hepatitis C Ab: NONREACTIVE

## 2022-05-07 LAB — RPR: RPR Ser Ql: NONREACTIVE

## 2022-05-07 LAB — HIV ANTIBODY (ROUTINE TESTING W REFLEX): HIV 1&2 Ab, 4th Generation: NONREACTIVE

## 2022-05-08 ENCOUNTER — Other Ambulatory Visit: Payer: Self-pay | Admitting: Family Medicine

## 2022-05-08 DIAGNOSIS — D72829 Elevated white blood cell count, unspecified: Secondary | ICD-10-CM

## 2022-05-13 ENCOUNTER — Inpatient Hospital Stay: Payer: BC Managed Care – PPO | Attending: Internal Medicine | Admitting: Internal Medicine

## 2022-05-13 ENCOUNTER — Encounter: Payer: Self-pay | Admitting: Internal Medicine

## 2022-05-13 ENCOUNTER — Inpatient Hospital Stay: Payer: BC Managed Care – PPO

## 2022-05-13 VITALS — BP 107/77 | HR 100 | Temp 99.0°F | Resp 17 | Ht 68.6 in | Wt 244.4 lb

## 2022-05-13 DIAGNOSIS — Z79899 Other long term (current) drug therapy: Secondary | ICD-10-CM | POA: Insufficient documentation

## 2022-05-13 DIAGNOSIS — D72828 Other elevated white blood cell count: Secondary | ICD-10-CM | POA: Insufficient documentation

## 2022-05-13 DIAGNOSIS — F17210 Nicotine dependence, cigarettes, uncomplicated: Secondary | ICD-10-CM | POA: Insufficient documentation

## 2022-05-13 DIAGNOSIS — F32A Depression, unspecified: Secondary | ICD-10-CM | POA: Insufficient documentation

## 2022-05-13 DIAGNOSIS — D72829 Elevated white blood cell count, unspecified: Secondary | ICD-10-CM | POA: Diagnosis not present

## 2022-05-13 LAB — CBC WITH DIFFERENTIAL/PLATELET
Abs Immature Granulocytes: 0.07 10*3/uL (ref 0.00–0.07)
Basophils Absolute: 0.1 10*3/uL (ref 0.0–0.1)
Basophils Relative: 1 %
Eosinophils Absolute: 0.4 10*3/uL (ref 0.0–0.5)
Eosinophils Relative: 3 %
HCT: 40.4 % (ref 36.0–46.0)
Hemoglobin: 13.5 g/dL (ref 12.0–15.0)
Immature Granulocytes: 1 %
Lymphocytes Relative: 24 %
Lymphs Abs: 3.1 10*3/uL (ref 0.7–4.0)
MCH: 30 pg (ref 26.0–34.0)
MCHC: 33.4 g/dL (ref 30.0–36.0)
MCV: 89.8 fL (ref 80.0–100.0)
Monocytes Absolute: 0.6 10*3/uL (ref 0.1–1.0)
Monocytes Relative: 5 %
Neutro Abs: 8.7 10*3/uL — ABNORMAL HIGH (ref 1.7–7.7)
Neutrophils Relative %: 66 %
Platelets: 370 10*3/uL (ref 150–400)
RBC: 4.5 MIL/uL (ref 3.87–5.11)
RDW: 11.9 % (ref 11.5–15.5)
WBC: 13 10*3/uL — ABNORMAL HIGH (ref 4.0–10.5)
nRBC: 0 % (ref 0.0–0.2)

## 2022-05-13 LAB — TECHNOLOGIST SMEAR REVIEW: Plt Morphology: ADEQUATE

## 2022-05-13 LAB — COMPREHENSIVE METABOLIC PANEL
ALT: 16 U/L (ref 0–44)
AST: 15 U/L (ref 15–41)
Albumin: 3.9 g/dL (ref 3.5–5.0)
Alkaline Phosphatase: 73 U/L (ref 38–126)
Anion gap: 11 (ref 5–15)
BUN: 7 mg/dL (ref 6–20)
CO2: 23 mmol/L (ref 22–32)
Calcium: 8.3 mg/dL — ABNORMAL LOW (ref 8.9–10.3)
Chloride: 101 mmol/L (ref 98–111)
Creatinine, Ser: 0.51 mg/dL (ref 0.44–1.00)
GFR, Estimated: >60 mL/min (ref >60)
Glucose, Bld: 100 mg/dL — ABNORMAL HIGH (ref 70–99)
Potassium: 3.8 mmol/L (ref 3.5–5.1)
Sodium: 135 mmol/L (ref 135–145)
Total Bilirubin: 0.6 mg/dL (ref 0.3–1.2)
Total Protein: 7.5 g/dL (ref 6.5–8.1)

## 2022-05-13 LAB — C-REACTIVE PROTEIN: CRP: 0.9 mg/dL (ref <1.0)

## 2022-05-13 LAB — LACTATE DEHYDROGENASE: LDH: 101 U/L (ref 98–192)

## 2022-05-13 NOTE — Progress Notes (Signed)
Elevated wbc's for years. Has heritedary boils genetic from her dad. These occur in her  thighs, buttocks for a couple of years now. Denies night sweats, no fevers. Denies body aches. Pt has anxiety attacks and depression. Was given a medication for anxiety and she did not like the loopy feeling it gave her.

## 2022-05-13 NOTE — Assessment & Plan Note (Addendum)
#   White count-February 2024-14,000; mild neutrophilia/lymphocytosis normal hemoglobin and platelets. Patient fairly asymptomatic from underlying blood work.  I had a Long discussion with the patient regarding multiple etiologies of elevated white count including but not limited to infection; inflammation; malignancy/leukemia etc.  Clinically suspicion for malignancy is small.  Recommend checking CBC CMP CRP LDH review of peripheral smear; peripheral blood flow cytometry; BCR ABL FISH.  Discussed regarding a bone marrow biopsy however given low clinical suspicion of any primary bone marrow process, bone marrow biopsy is not recommended at this time.  Await above workup.   # smoking: Discussed with the patient regarding the ill effects of smoking- including but not limited to cardiac lung and vascular diseases and malignancies. Counseled against smoking.  Discussed smoking is one potential reason for elevated white count.   # Anxiety/depression: Recommend follow-up with PCP to discuss further.  Thank you Dr.Sowles  for allowing me to participate in the care of your pleasant patient. Please do not hesitate to contact me with questions or concerns in the interim.  # DISPOSITION: # blood work today # Follow up in 2-3 weeks- MD; No labs-Dr.B

## 2022-05-13 NOTE — Progress Notes (Signed)
[facility] OFFICE PROGRESS NOTE  Patient Care Team: Steele Sizer, MD as PCP - General (Family Medicine)   # HEMATOLOGY HISTORY:  # LEUCOCYTOSIS- WBC-; N; L; Hb- platelets   Oncology History   No history exists.     INTERVAL HISTORY: Alone. Ambulating independently.   Maureen Gutierrez 22 y.o.  female pleasant patient above history of leucocytosis is here for follow up.   Patient noted to have elevated wbc's for years.   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  These occur in her thighs, buttocks for a couple of years now. Denies night sweats, no fevers. Denies body aches.   Pt has anxiety attacks and depression.  Currently not on any medication.  Night sweats:none Weight loss: none Early satiety:none  Infections: hidradenitis-1-2 every month; more prevalent in summer. No Antibiotics.   Splenectomy: NONE Steroids: NONE Smoke: 1/2 ppd Allergies: none Skin rash:none Dentition-normal infections.  #Review of Systems  Constitutional:  Negative for chills, diaphoresis, fever, malaise/fatigue and weight loss.  HENT:  Negative for nosebleeds and sore throat.   Eyes:  Negative for double vision.  Respiratory:  Negative for cough, hemoptysis, sputum production, shortness of breath and wheezing.   Cardiovascular:  Negative for chest pain, palpitations, orthopnea and leg swelling.  Gastrointestinal:  Negative for abdominal pain, blood in stool, constipation, diarrhea, heartburn, melena, nausea and vomiting.  Genitourinary:  Negative for dysuria, frequency and urgency.  Musculoskeletal:  Negative for back pain and joint pain.  Skin: Negative.  Negative for itching and rash.  Neurological:  Negative for dizziness, tingling, focal weakness, weakness and headaches.  Endo/Heme/Allergies:  Does not bruise/bleed easily.  Psychiatric/Behavioral:  Positive for depression. The patient is nervous/anxious. The patient does not have insomnia.       PAST MEDICAL HISTORY :   Past Medical History:  Diagnosis Date   Anxiety    Eczema    Heavy periods    Ovarian cyst    left - Dani Gobble (Midwife)   Painful menstrual periods     PAST SURGICAL HISTORY :   Past Surgical History:  Procedure Laterality Date   TONSILLECTOMY AND ADENOIDECTOMY      FAMILY HISTORY :   Family History  Problem Relation Age of Onset   Obesity Mother    Diabetes Father    Hypertension Father    ADD / ADHD Brother    Cancer Maternal Grandmother    Rheum arthritis Maternal Grandmother    Breast cancer Neg Hx    Ovarian cancer Neg Hx    Colon cancer Neg Hx     SOCIAL HISTORY:   Social History   Tobacco Use   Smoking status: Every Day    Packs/day: 0.50    Types: Cigarettes   Smokeless tobacco: Never  Vaping Use   Vaping Use: Former   Start date: 01/30/2018   Substances: Flavoring  Substance Use Topics   Alcohol use: Not Currently    Comment: rare   Drug use: No    ALLERGIES:  has No Known Allergies.  MEDICATIONS:  Current Outpatient Medications  Medication Sig Dispense Refill   desogestrel-ethinyl estradiol (MIRCETTE) 0.15-0.02/0.01 MG (21/5) tablet Take 1 tablet by mouth at bedtime. 84 tablet 0   No current facility-administered medications for this visit.    PHYSICAL EXAMINATION:  BP 107/77 (BP Location: Left Arm, Patient Position: Sitting)   Pulse 100   Temp 99 F (37.2 C) (Tympanic)   Resp 17   Ht 5' 8.6" (1.742 m)   Wt  244 lb 6.4 oz (110.9 kg)   LMP 04/11/2022   SpO2 99%   BMI 36.51 kg/m   Filed Weights   05/13/22 1407  Weight: 244 lb 6.4 oz (110.9 kg)    Physical Exam Vitals and nursing note reviewed.  HENT:     Head: Normocephalic and atraumatic.     Mouth/Throat:     Pharynx: Oropharynx is clear.  Eyes:     Extraocular Movements: Extraocular movements intact.     Pupils: Pupils are equal, round, and reactive to light.  Cardiovascular:     Rate and Rhythm: Normal rate and regular rhythm.  Pulmonary:     Comments:  Decreased breath sounds bilaterally.  Abdominal:     Palpations: Abdomen is soft.  Musculoskeletal:        General: Normal range of motion.     Cervical back: Normal range of motion.  Skin:    General: Skin is warm.  Neurological:     General: No focal deficit present.     Mental Status: She is alert and oriented to person, place, and time.  Psychiatric:        Behavior: Behavior normal.        Judgment: Judgment normal.        LABORATORY DATA:  I have reviewed the data as listed    Component Value Date/Time   NA 135 05/13/2022 1452   NA 141 11/08/2016 1034   K 3.8 05/13/2022 1452   CL 101 05/13/2022 1452   CO2 23 05/13/2022 1452   GLUCOSE 100 (H) 05/13/2022 1452   BUN 7 05/13/2022 1452   BUN 10 11/08/2016 1034   CREATININE 0.51 05/13/2022 1452   CALCIUM 8.3 (L) 05/13/2022 1452   PROT 7.5 05/13/2022 1452   PROT 7.0 11/08/2016 1034   ALBUMIN 3.9 05/13/2022 1452   ALBUMIN 4.2 11/08/2016 1034   AST 15 05/13/2022 1452   ALT 16 05/13/2022 1452   ALKPHOS 73 05/13/2022 1452   BILITOT 0.6 05/13/2022 1452   BILITOT 0.3 11/08/2016 1034   GFRNONAA >60 05/13/2022 1452   GFRAA CANCELED 11/08/2016 1034    No results found for: "SPEP", "UPEP"  Lab Results  Component Value Date   WBC 13.0 (H) 05/13/2022   NEUTROABS 8.7 (H) 05/13/2022   HGB 13.5 05/13/2022   HCT 40.4 05/13/2022   MCV 89.8 05/13/2022   PLT 370 05/13/2022      Chemistry      Component Value Date/Time   NA 135 05/13/2022 1452   NA 141 11/08/2016 1034   K 3.8 05/13/2022 1452   CL 101 05/13/2022 1452   CO2 23 05/13/2022 1452   BUN 7 05/13/2022 1452   BUN 10 11/08/2016 1034   CREATININE 0.51 05/13/2022 1452      Component Value Date/Time   CALCIUM 8.3 (L) 05/13/2022 1452   ALKPHOS 73 05/13/2022 1452   AST 15 05/13/2022 1452   ALT 16 05/13/2022 1452   BILITOT 0.6 05/13/2022 1452   BILITOT 0.3 11/08/2016 1034       RADIOGRAPHIC STUDIES: I have personally reviewed the radiological images as  listed and agreed with the findings in the report. No results found.   ASSESSMENT & PLAN:  Chronic neutrophilia # White count-February [id number]; mild neutrophilia/lymphocytosis normal hemoglobin and platelets. Patient fairly asymptomatic from underlying blood work.  I had a Long discussion with the patient regarding multiple etiologies of elevated white count including but not limited to infection; inflammation; malignancy/leukemia etc.  Clinically suspicion for malignancy  is small.  Recommend checking CBC CMP CRP LDH review of peripheral smear; peripheral blood flow cytometry; BCR ABL FISH.  Discussed regarding a bone marrow biopsy however given low clinical suspicion of any primary bone marrow process, bone marrow biopsy is not recommended at this time.  Await above workup.   # smoking: Discussed with the patient regarding the ill effects of smoking- including but not limited to cardiac lung and vascular diseases and malignancies. Counseled against smoking.  Discussed smoking is one potential reason for elevated white count.   # Anxiety/depression: Recommend follow-up with PCP to discuss further.  Thank you Dr.Sowles  for allowing me to participate in the care of your pleasant patient. Please do not hesitate to contact me with questions or concerns in the interim.  # DISPOSITION: # blood work today # Follow up in 2-3 weeks- MD; No labs-Dr.B   Orders Placed This Encounter  Procedures   BCR-ABL1 FISH    Standing Status:   Future    Number of Occurrences:   1    Standing Expiration Date:   05/14/2023   Flow cytometry panel-leukemia/lymphoma work-up    Standing Status:   Future    Number of Occurrences:   1    Standing Expiration Date:   05/14/2023   JAK2 genotypr    Standing Status:   Future    Number of Occurrences:   1    Standing Expiration Date:   05/14/2023   C-reactive protein    Standing Status:   Future    Number of Occurrences:   1    Standing Expiration Date:   05/14/2023    CBC with Differential/Platelet    Please do manual diff- and call if abnormal cells seen    Standing Status:   Future    Number of Occurrences:   1    Standing Expiration Date:   05/14/2023   Lactate dehydrogenase    Standing Status:   Future    Number of Occurrences:   1    Standing Expiration Date:   05/14/2023   Comprehensive metabolic panel    Standing Status:   Future    Number of Occurrences:   1    Standing Expiration Date:   05/14/2023   Technologist smear review    Standing Status:   Future    Number of Occurrences:   1    Standing Expiration Date:   05/14/2023    Order Specific Question:   Clinical information:    Answer:   leucocytosis   All questions were answered. The patient knows to call the clinic with any problems, questions or concerns.      Cammie Sickle, MD 05/13/2022 3:36 PM

## 2022-05-15 LAB — COMP PANEL: LEUKEMIA/LYMPHOMA

## 2022-05-16 LAB — BCR-ABL1 FISH
Cells Analyzed: 200
Cells Counted: 200

## 2022-05-19 LAB — JAK2 GENOTYPR

## 2022-05-24 ENCOUNTER — Encounter: Payer: Self-pay | Admitting: Obstetrics and Gynecology

## 2022-05-24 ENCOUNTER — Ambulatory Visit (INDEPENDENT_AMBULATORY_CARE_PROVIDER_SITE_OTHER): Payer: BC Managed Care – PPO

## 2022-05-24 VITALS — BP 115/79 | HR 94 | Ht 68.0 in | Wt 246.6 lb

## 2022-05-24 DIAGNOSIS — Z3201 Encounter for pregnancy test, result positive: Secondary | ICD-10-CM | POA: Diagnosis not present

## 2022-05-24 DIAGNOSIS — N912 Amenorrhea, unspecified: Secondary | ICD-10-CM

## 2022-05-24 DIAGNOSIS — Z3200 Encounter for pregnancy test, result unknown: Secondary | ICD-10-CM

## 2022-05-24 LAB — POCT URINE PREGNANCY: Preg Test, Ur: POSITIVE — AB

## 2022-05-24 NOTE — Progress Notes (Signed)
    NURSE VISIT NOTE  Subjective:    Patient ID: Maureen Gutierrez, female    DOB: 10-Oct-2000, 22 y.o.   MRN: [id number]  HPI  Patient is a 22 y.o. G0P0000 female who presents for evaluation of amenorrhea. She believes she could be pregnant. Pregnancy is desired. Sexual Activity: single partner, contraception: none. Current symptoms also include: breast tenderness, fatigue, frequent urination, nausea, and metallic taste in mouth . Last period was normal.    Objective:    BP 115/79   Pulse 94   Ht 5' 8"$  (1.727 m)   Wt 246 lb 9.6 oz (111.9 kg)   LMP 04/12/2022   BMI 37.50 kg/m   Lab Review  Results for orders placed or performed in visit on 05/24/22  POCT urine pregnancy  Result Value Ref Range   Preg Test, Ur Positive (A) Negative    Assessment:   1. Possible pregnancy, not yet confirmed     Plan:   Pregnancy Test: Positive  Estimated Date of Delivery: 01/17/23. Encouraged well-balanced diet, plenty of rest when needed, pre-natal vitamins daily and walking for exercise.  Discussed self-help for nausea, avoiding OTC medications until consulting provider or pharmacist, other than Tylenol as needed, minimal caffeine (1-2 cups daily) and avoiding alcohol.   She will schedule her nurse visit @ [redacted] wks pregnant, u/s for dating and labs @10$  wk, and NOB visit at [redacted] wk pregnant.    Feel free to call with any questions.     Marykay Lex, CMA

## 2022-05-28 ENCOUNTER — Telehealth: Payer: Self-pay

## 2022-06-03 ENCOUNTER — Inpatient Hospital Stay: Payer: BC Managed Care – PPO | Attending: Internal Medicine | Admitting: Internal Medicine

## 2022-06-03 ENCOUNTER — Encounter: Payer: Self-pay | Admitting: Internal Medicine

## 2022-06-03 VITALS — BP 117/75 | HR 84 | Temp 98.1°F | Resp 16 | Wt 245.5 lb

## 2022-06-03 DIAGNOSIS — D72829 Elevated white blood cell count, unspecified: Secondary | ICD-10-CM | POA: Diagnosis not present

## 2022-06-03 DIAGNOSIS — Z3A01 Less than 8 weeks gestation of pregnancy: Secondary | ICD-10-CM | POA: Diagnosis not present

## 2022-06-03 DIAGNOSIS — O99111 Other diseases of the blood and blood-forming organs and certain disorders involving the immune mechanism complicating pregnancy, first trimester: Secondary | ICD-10-CM | POA: Insufficient documentation

## 2022-06-03 DIAGNOSIS — D72828 Other elevated white blood cell count: Secondary | ICD-10-CM | POA: Diagnosis not present

## 2022-06-03 DIAGNOSIS — F17210 Nicotine dependence, cigarettes, uncomplicated: Secondary | ICD-10-CM | POA: Insufficient documentation

## 2022-06-03 DIAGNOSIS — O99331 Smoking (tobacco) complicating pregnancy, first trimester: Secondary | ICD-10-CM | POA: Insufficient documentation

## 2022-06-03 NOTE — Progress Notes (Signed)
[facility] OFFICE PROGRESS NOTE  Patient Care Team: Steele Sizer, MD as PCP - General (Family Medicine)   # HEMATOLOGY HISTORY:  # LEUCOCYTOSIS- WBC-; N; L; Hb- platelets   Oncology History   No history exists.     INTERVAL HISTORY: Alone. Ambulating independently.   Maureen Gutierrez pleasant patient above history of chronic leucocytosis-likely secondary to hidradenitis is here for follow up/review results of blood work.  Patient is 61w3dpregnant.   Currently denies any active infection.   #Review of Systems  Constitutional:  Negative for chills, diaphoresis, fever, malaise/fatigue and weight loss.  HENT:  Negative for nosebleeds and sore throat.   Eyes:  Negative for double vision.  Respiratory:  Negative for cough, hemoptysis, sputum production, shortness of breath and wheezing.   Cardiovascular:  Negative for chest pain, palpitations, orthopnea and leg swelling.  Gastrointestinal:  Negative for abdominal pain, blood in stool, constipation, diarrhea, heartburn, melena, nausea and vomiting.  Genitourinary:  Negative for dysuria, frequency and urgency.  Musculoskeletal:  Negative for back pain and joint pain.  Skin: Negative.  Negative for itching and rash.  Neurological:  Negative for dizziness, tingling, focal weakness, weakness and headaches.  Endo/Heme/Allergies:  Does not bruise/bleed easily.  Psychiatric/Behavioral:  Positive for depression. The patient is nervous/anxious. The patient does not have insomnia.       PAST MEDICAL HISTORY :  Past Medical History:  Diagnosis Date   Anxiety    Eczema    Heavy periods    Ovarian cyst    left - MDani Gobble(Midwife)   Painful menstrual periods     PAST SURGICAL HISTORY :   Past Surgical History:  Procedure Laterality Date   TONSILLECTOMY AND ADENOIDECTOMY      FAMILY HISTORY :   Family History  Problem Relation Age of Onset   Obesity Mother    Diabetes Father     Hypertension Father    ADD / ADHD Brother    Cancer Maternal Grandmother    Rheum arthritis Maternal Grandmother    Breast cancer Neg Hx    Ovarian cancer Neg Hx    Colon cancer Neg Hx     SOCIAL HISTORY:   Social History   Tobacco Use   Smoking status: Every Day    Packs/day: 0.50    Types: Cigarettes   Smokeless tobacco: Never  Vaping Use   Vaping Use: Former   Start date: 01/30/2018   Substances: Flavoring  Substance Use Topics   Alcohol use: Not Currently    Comment: rare   Drug use: No    ALLERGIES:  has No Known Allergies.  MEDICATIONS:  No current outpatient medications on file.   No current facility-administered medications for this visit.    PHYSICAL EXAMINATION:  BP 117/75 (BP Location: Left Arm, Patient Position: Sitting)   Pulse 84   Temp 98.1 F (36.7 C) (Tympanic)   Resp 16   Wt 245 lb 8 oz (111.4 kg)   LMP 04/12/2022   BMI 37.33 kg/m   Filed Weights   06/03/22 1000  Weight: 245 lb 8 oz (111.4 kg)    Physical Exam Vitals and nursing note reviewed.  HENT:     Head: Normocephalic and atraumatic.     Mouth/Throat:     Pharynx: Oropharynx is clear.  Eyes:     Extraocular Movements: Extraocular movements intact.     Pupils: Pupils are equal, round, and reactive to light.  Cardiovascular:  Rate and Rhythm: Normal rate and regular rhythm.  Pulmonary:     Comments: Decreased breath sounds bilaterally.  Abdominal:     Palpations: Abdomen is soft.  Musculoskeletal:        General: Normal range of motion.     Cervical back: Normal range of motion.  Skin:    General: Skin is warm.  Neurological:     General: No focal deficit present.     Mental Status: She is alert and oriented to person, place, and time.  Psychiatric:        Behavior: Behavior normal.        Judgment: Judgment normal.        LABORATORY DATA:  I have reviewed the data as listed    Component Value Date/Time   NA 135 05/13/2022 1452   NA 141 11/08/2016 1034    K 3.8 05/13/2022 1452   CL 101 05/13/2022 1452   CO2 23 05/13/2022 1452   GLUCOSE 100 (H) 05/13/2022 1452   BUN 7 05/13/2022 1452   BUN 10 11/08/2016 1034   CREATININE 0.51 05/13/2022 1452   CALCIUM 8.3 (L) 05/13/2022 1452   PROT 7.5 05/13/2022 1452   PROT 7.0 11/08/2016 1034   ALBUMIN 3.9 05/13/2022 1452   ALBUMIN 4.2 11/08/2016 1034   AST 15 05/13/2022 1452   ALT 16 05/13/2022 1452   ALKPHOS 73 05/13/2022 1452   BILITOT 0.6 05/13/2022 1452   BILITOT 0.3 11/08/2016 1034   GFRNONAA >60 05/13/2022 1452   GFRAA CANCELED 11/08/2016 1034    No results found for: "SPEP", "UPEP"  Lab Results  Component Value Date   WBC 13.0 (H) 05/13/2022   NEUTROABS 8.7 (H) 05/13/2022   HGB 13.5 05/13/2022   HCT 40.4 05/13/2022   MCV 89.8 05/13/2022   PLT 370 05/13/2022      Chemistry      Component Value Date/Time   NA 135 05/13/2022 1452   NA 141 11/08/2016 1034   K 3.8 05/13/2022 1452   CL 101 05/13/2022 1452   CO2 23 05/13/2022 1452   BUN 7 05/13/2022 1452   BUN 10 11/08/2016 1034   CREATININE 0.51 05/13/2022 1452      Component Value Date/Time   CALCIUM 8.3 (L) 05/13/2022 1452   ALKPHOS 73 05/13/2022 1452   AST 15 05/13/2022 1452   ALT 16 05/13/2022 1452   BILITOT 0.6 05/13/2022 1452   BILITOT 0.3 11/08/2016 1034       RADIOGRAPHIC STUDIES: I have personally reviewed the radiological images as listed and agreed with the findings in the report. No results found.   ASSESSMENT & PLAN:  Chronic neutrophilia # White count-February [id number]; mild neutrophilia/lymphocytosis normal hemoglobin and platelets. Patient fairly asymptomatic from underlying blood work.  Likely reactive to hidradenitis  # Review of peripheral smear; peripheral blood flow cytometry; BCR ABL FISH-negative. JAK-2 testing negative.  I would not recommend a bone marrow biopsy  given low clinical suspicion of any primary bone marrow process.  # smoking:  quit smoking [2 weeks ago- pregnancy] Discussed  smoking is one potential reason for elevated white count.   # Hypocalcemia: 8.7- recommend vit D/pre-natal.   # hidradenitis-1-2 every month; more prevalent in summer.  Currently not on antibiotics.  Defer to dermatology/PCP for further recommendations/management.  # DISPOSITION: # Follow up as needed- Dr.B   No orders of the defined types were placed in this encounter.  All questions were answered. The patient knows to call the clinic with any problems,  questions or concerns.      Cammie Sickle, MD 06/03/2022 11:15 AM

## 2022-06-03 NOTE — Assessment & Plan Note (Addendum)
#   White count-February 2024-14,000; mild neutrophilia/lymphocytosis normal hemoglobin and platelets. Patient fairly asymptomatic from underlying blood work.  Likely reactive to hidradenitis  # Review of peripheral smear; peripheral blood flow cytometry; BCR ABL FISH-negative. JAK-2 testing negative.  I would not recommend a bone marrow biopsy  given low clinical suspicion of any primary bone marrow process.  # smoking:  quit smoking [2 weeks ago- pregnancy] Discussed smoking is one potential reason for elevated white count.   # Hypocalcemia: 8.7- recommend vit D/pre-natal.   # hidradenitis-1-2 every month; more prevalent in summer.  Currently not on antibiotics.  Defer to dermatology/PCP for further recommendations/management.  # DISPOSITION: # Follow up as needed- Dr.B

## 2022-06-03 NOTE — Progress Notes (Signed)
Patient is 83w3dpregnant.

## 2022-06-07 ENCOUNTER — Ambulatory Visit (INDEPENDENT_AMBULATORY_CARE_PROVIDER_SITE_OTHER): Payer: BC Managed Care – PPO

## 2022-06-07 ENCOUNTER — Telehealth: Payer: Self-pay | Admitting: Obstetrics and Gynecology

## 2022-06-07 VITALS — Ht 67.0 in | Wt 241.0 lb

## 2022-06-07 DIAGNOSIS — Z3A40 40 weeks gestation of pregnancy: Secondary | ICD-10-CM | POA: Diagnosis not present

## 2022-06-07 DIAGNOSIS — Z113 Encounter for screening for infections with a predominantly sexual mode of transmission: Secondary | ICD-10-CM

## 2022-06-07 DIAGNOSIS — O480 Post-term pregnancy: Secondary | ICD-10-CM | POA: Diagnosis not present

## 2022-06-07 DIAGNOSIS — Z3687 Encounter for antenatal screening for uncertain dates: Secondary | ICD-10-CM

## 2022-06-07 DIAGNOSIS — O26843 Uterine size-date discrepancy, third trimester: Secondary | ICD-10-CM | POA: Diagnosis not present

## 2022-06-07 DIAGNOSIS — Z3401 Encounter for supervision of normal first pregnancy, first trimester: Secondary | ICD-10-CM

## 2022-06-07 DIAGNOSIS — Z3403 Encounter for supervision of normal first pregnancy, third trimester: Secondary | ICD-10-CM | POA: Insufficient documentation

## 2022-06-07 NOTE — Telephone Encounter (Signed)
The patient called to confirm scheduled appointments

## 2022-06-07 NOTE — Progress Notes (Signed)
New OB Intake  I connected with  Maureen Gutierrez on 06/07/22 at  8:15 AM EST by telephone Video Visit and verified that I am speaking with the correct person using two identifiers. Nurse is located at [employer] and pt is located at home.  I discussed the limitations, risks, security and privacy concerns of performing an evaluation and management service by telephone and the availability of in person appointments. I also discussed with the patient that there may be a patient responsible charge related to this service. The patient expressed understanding and agreed to proceed.  I explained I am completing New OB Intake today. We discussed her EDD of 01/17/2023  that is based on LMP of 04/12/2022. Pt is G1/P0. I reviewed her allergies, medications, Medical/Surgical/OB history, and appropriate screenings. There are cats in the home in the home  no If yes Based on history, this is a/an pregnancy uncomplicated .   Patient Active Problem List   Diagnosis Date Noted   Chronic neutrophilia 05/13/2022   Panic disorder 09/05/2020   Tobacco dependence 07/26/2019   Hidradenitis suppurativa 01/21/2019   Keratosis pilaris 02/16/2018   Menorrhagia with regular cycle 02/16/2018   Left ovarian cyst 01/18/2017    Concerns addressed today  Delivery Plans:  Plans to deliver [facility]  Anatomy US Explained first scheduled [place] will be around 19 weeks. Labs Discussed Johnsie Cancel genetic screening with patient. Patient DECLINES  genetic testing to be drawn at new OB visit. Discussed possible labs to be drawn at new OB appointment.  COVID Vaccine Patient has had COVID vaccine.   Social Determinants of Health Food Insecurity: denies food insecurity WIC Referral: Patient is not interested in referral to [facility]  Transportation: Patient denies transportation needs. Childcare: Discussed no children allowed at ultrasound appointments.     Clinical Staff Provider  Office Location  [place]  Ob/Gyn Dating  Not found.  Language  English Anatomy US    Flu Vaccine  Yes has had Genetic Screen  NIPS:   TDaP vaccine   offer Hgb A1C or  GTT Early : Third trimester :   Covid Yes,has had    LAB RESULTS   Rhogam     Blood Type     Feeding Plan breast Antibody    Contraception Unsure Rubella    Circumcision yes RPR NON-REACTIVE (02/05 1030)   Pediatrician  unsure HBsAg     Support Person boyfriend HIV NON-REACTIVE (02/05 1030)  Prenatal Classes Is interested  Varicella     GBS  (For PCN allergy, check sensitivities)   BTL Consent  Hep C     VBAC Consent  Pap No results found for: "DIAGPAP"    Hgb Electro      CF      SMA          First visit review I reviewed new OB appt with pt. I explained she will have ob bloodwork and pap smear/pelvic exam if indicated. Explained pt will be seen by Gigi Gin CNM  at first visit; encounter routed to appropriate provider.   Landis Gandy, [facility] 06/07/2022  8:36 AM

## 2022-06-07 NOTE — Telephone Encounter (Signed)
I attempt times x2 to reach via phone. No answer, I left generic message for the patient to call back to confirm scheduling changes for [facility] and to notify of new appointment scheduled for NOB phys with Dr Amalia Hailey on 4/5.

## 2022-06-07 NOTE — Addendum Note (Signed)
Addended by: Landis Gandy on: 06/07/2022 09:40 AM   Modules accepted: Orders

## 2022-06-18 ENCOUNTER — Ambulatory Visit: Payer: BC Managed Care – PPO | Admitting: Obstetrics and Gynecology

## 2022-06-19 ENCOUNTER — Ambulatory Visit: Payer: BC Managed Care – PPO | Admitting: Obstetrics and Gynecology

## 2022-06-24 ENCOUNTER — Ambulatory Visit
Admission: RE | Admit: 2022-06-24 | Discharge: 2022-06-24 | Disposition: A | Discharge status: Home / Self Care | Payer: BC Managed Care – PPO | Source: Ambulatory Visit | Attending: Obstetrics | Admitting: Obstetrics

## 2022-06-24 ENCOUNTER — Other Ambulatory Visit: Payer: Self-pay

## 2022-06-24 ENCOUNTER — Other Ambulatory Visit: Payer: Self-pay | Admitting: Obstetrics

## 2022-06-24 ENCOUNTER — Other Ambulatory Visit (HOSPITAL_COMMUNITY)
Admission: RE | Admit: 2022-06-24 | Discharge: 2022-06-24 | Disposition: A | Discharge status: Home / Self Care | Payer: BC Managed Care – PPO | Source: Ambulatory Visit | Attending: Obstetrics | Admitting: Obstetrics

## 2022-06-24 ENCOUNTER — Other Ambulatory Visit: Payer: BC Managed Care – PPO

## 2022-06-24 DIAGNOSIS — Z3401 Encounter for supervision of normal first pregnancy, first trimester: Secondary | ICD-10-CM

## 2022-06-24 DIAGNOSIS — Z3687 Encounter for antenatal screening for uncertain dates: Secondary | ICD-10-CM | POA: Diagnosis not present

## 2022-06-24 DIAGNOSIS — Z3A10 10 weeks gestation of pregnancy: Secondary | ICD-10-CM

## 2022-06-24 DIAGNOSIS — Z1379 Encounter for other screening for genetic and chromosomal anomalies: Secondary | ICD-10-CM | POA: Diagnosis not present

## 2022-06-24 DIAGNOSIS — Z113 Encounter for screening for infections with a predominantly sexual mode of transmission: Secondary | ICD-10-CM | POA: Diagnosis not present

## 2022-06-25 ENCOUNTER — Telehealth: Payer: Self-pay

## 2022-06-25 ENCOUNTER — Encounter: Payer: Self-pay | Admitting: Obstetrics

## 2022-06-25 LAB — CBC/D/PLT+RPR+RH+ABO+RUBIGG...
Antibody Screen: NEGATIVE
Basophils Absolute: 0.1 10*3/uL (ref 0.0–0.2)
Basos: 0 %
EOS (ABSOLUTE): 0.2 10*3/uL (ref 0.0–0.4)
Eos: 2 %
HCV Ab: NONREACTIVE
HIV Screen 4th Generation wRfx: NONREACTIVE
Hematocrit: 34.5 % (ref 34.0–46.6)
Hemoglobin: 11.6 g/dL (ref 11.1–15.9)
Hepatitis B Surface Ag: NEGATIVE
Immature Grans (Abs): 0 10*3/uL (ref 0.0–0.1)
Immature Granulocytes: 0 %
Lymphocytes Absolute: 2.4 10*3/uL (ref 0.7–3.1)
Lymphs: 20 %
MCH: 30.2 pg (ref 26.6–33.0)
MCHC: 33.6 g/dL (ref 31.5–35.7)
MCV: 90 fL (ref 79–97)
Monocytes Absolute: 0.6 10*3/uL (ref 0.1–0.9)
Monocytes: 5 %
Neutrophils Absolute: 8.8 10*3/uL — ABNORMAL HIGH (ref 1.4–7.0)
Neutrophils: 73 %
Platelets: 324 10*3/uL (ref 150–450)
RBC: 3.84 x10E6/uL (ref 3.77–5.28)
RDW: 12.3 % (ref 11.7–15.4)
RPR Ser Ql: NONREACTIVE
Rh Factor: POSITIVE
Rubella Antibodies, IGG: 1.26 index (ref >0.99)
Varicella zoster IgG: 464 index (ref >165)
WBC: 12 10*3/uL — ABNORMAL HIGH (ref 3.4–10.8)

## 2022-06-25 LAB — MICROSCOPIC EXAMINATION
Casts: NONE SEEN /lpf
RBC, Urine: NONE SEEN /hpf (ref 0–2)

## 2022-06-25 LAB — URINALYSIS, ROUTINE W REFLEX MICROSCOPIC
Bilirubin, UA: NEGATIVE
Ketones, UA: NEGATIVE
Nitrite, UA: NEGATIVE
Protein,UA: NEGATIVE
RBC, UA: NEGATIVE
Specific Gravity, UA: 1.026 (ref 1.005–1.030)
Urobilinogen, Ur: 0.2 mg/dL (ref 0.2–1.0)
pH, UA: 6 (ref 5.0–7.5)

## 2022-06-25 LAB — HCV INTERPRETATION

## 2022-06-25 NOTE — Telephone Encounter (Signed)
Pt calling; has seen her results from yesterday and knows the provider probably hasn't seen them yet. Pt concerned about bacteria and glucose being abnormal.  What, if anything, needs to be done?  [phone]

## 2022-06-26 LAB — URINE CYTOLOGY ANCILLARY ONLY
Chlamydia: NEGATIVE
Comment: NEGATIVE
Comment: NORMAL
Neisseria Gonorrhea: NEGATIVE

## 2022-06-26 LAB — CULTURE, OB URINE

## 2022-06-26 LAB — URINE CULTURE, OB REFLEX

## 2022-06-29 LAB — MATERNIT 21 PLUS CORE, BLOOD
Fetal Fraction: 4
Result (T21): NEGATIVE
Trisomy 13 (Patau syndrome): NEGATIVE
Trisomy 18 (Edwards syndrome): NEGATIVE
Trisomy 21 (Down syndrome): NEGATIVE

## 2022-07-01 DIAGNOSIS — Z419 Encounter for procedure for purposes other than remedying health state, unspecified: Secondary | ICD-10-CM | POA: Diagnosis not present

## 2022-07-05 ENCOUNTER — Encounter: Payer: BC Managed Care – PPO | Admitting: Obstetrics and Gynecology

## 2022-07-11 ENCOUNTER — Encounter: Payer: BC Managed Care – PPO | Admitting: Obstetrics

## 2022-07-17 ENCOUNTER — Other Ambulatory Visit (HOSPITAL_COMMUNITY)
Admission: RE | Admit: 2022-07-17 | Discharge: 2022-07-17 | Disposition: A | Discharge status: Home / Self Care | Payer: BC Managed Care – PPO | Source: Ambulatory Visit | Attending: Obstetrics and Gynecology | Admitting: Obstetrics and Gynecology

## 2022-07-17 ENCOUNTER — Encounter: Payer: Self-pay | Admitting: Obstetrics

## 2022-07-17 ENCOUNTER — Ambulatory Visit (INDEPENDENT_AMBULATORY_CARE_PROVIDER_SITE_OTHER): Payer: BC Managed Care – PPO | Admitting: Obstetrics

## 2022-07-17 VITALS — BP 112/70 | HR 80 | Wt 237.5 lb

## 2022-07-17 DIAGNOSIS — Z124 Encounter for screening for malignant neoplasm of cervix: Secondary | ICD-10-CM

## 2022-07-17 DIAGNOSIS — Z3A13 13 weeks gestation of pregnancy: Secondary | ICD-10-CM | POA: Diagnosis not present

## 2022-07-17 DIAGNOSIS — R81 Glycosuria: Secondary | ICD-10-CM

## 2022-07-17 DIAGNOSIS — Z3401 Encounter for supervision of normal first pregnancy, first trimester: Secondary | ICD-10-CM | POA: Diagnosis not present

## 2022-07-17 DIAGNOSIS — O99210 Obesity complicating pregnancy, unspecified trimester: Secondary | ICD-10-CM

## 2022-07-17 DIAGNOSIS — Z113 Encounter for screening for infections with a predominantly sexual mode of transmission: Secondary | ICD-10-CM | POA: Insufficient documentation

## 2022-07-17 DIAGNOSIS — Z3491 Encounter for supervision of normal pregnancy, unspecified, first trimester: Secondary | ICD-10-CM

## 2022-07-17 NOTE — Progress Notes (Signed)
New Obstetric Patient H&P    Chief Complaint: "Desires prenatal care"   History of Present Illness: Patient is a 22 y.o. G1P0000 Not Hispanic or Latino female, LMP 04/12/22 presents with amenorrhea and positive home pregnancy test. Based on her  LMP, her EDD is Estimated Date of Delivery: 01/17/23 and her EGA is [redacted]w[redacted]d. Cycles are 5 days, regular, and occur approximately every : 28 days. Had first Pap smear today, 07/17/22.  She had a urine pregnancy test which was positive approximately 1-2  month(s)  ago. Her last menstrual period was normal and lasted for  approximately 5  day(s). Since her LMP she claims she has experienced breast tenderness, nausea, and fatigue. She denies vaginal bleeding. Her past medical history is noncontributory. Her prior pregnancies are notable for SAB.  Since her LMP, she admits to the use of tobacco products  yes She claims she has lost approx. 5 pounds since the start of her pregnancy.  There are cats in the home in the home  no. She admits close contact with children on a regular basis  no  She has had chicken pox in the past no She has had Tuberculosis exposures, symptoms, or previously tested positive for TB   not applicable Current or past history of domestic violence. no  Genetic Screening/Teratology Counseling: (Includes patient, baby's father, or anyone in either family with:)   1. Patient's age >/= 22 at [facility]  no 2. Thalassemia ([place], [place], Mediterranean, or Asian background): MCV<80  no 3. Neural tube defect (meningomyelocele, spina bifida, anencephaly)  no 4. Congenital heart defect  no  5. Down syndrome  no 6. Tay-Sachs (Jewish, [place]  no 7. Canavan's Disease  no 8. Sickle cell disease or trait (African)  no  9. Hemophilia or other blood disorders  no  10. Muscular dystrophy  no  11. Cystic fibrosis  no  12. Huntington's Chorea  no  13. Mental retardation/autism  no 14. Other inherited genetic or chromosomal disorder   no 15. Maternal metabolic disorder (DM, PKU, etc)  no 16. Patient or FOB with a child with a birth defect not listed above no  16a. Patient or FOB with a birth defect themselves no 17. Recurrent pregnancy loss, or stillbirth  no  18. Any medications since LMP other than prenatal vitamins (include vitamins, supplements, OTC meds, drugs, alcohol)  no 19. Any other genetic/environmental exposure to discuss  no  Infection History:   1. Lives with someone with TB or TB exposed  not applicable  2. Patient or partner has history of genital herpes  no 3. Rash or viral illness since LMP  no 4. History of STI (GC, CT, HPV, syphilis, HIV)  no 5. History of recent travel :  no  Other pertinent information:  no     Review of Systems:10 point review of systems negative unless otherwise noted in HPI  Past Medical History:  Past Medical History:  Diagnosis Date   Anxiety    Eczema    Heavy periods    Ovarian cyst    left - [name]   Painful menstrual periods     Past Surgical History:  Past Surgical History:  Procedure Laterality Date   TONSILLECTOMY AND ADENOIDECTOMY      Gynecologic History: Patient's last menstrual period was 04/12/2022 (approximate).  Obstetric History: G1P0000  Family History:  Family History  Problem Relation Age of Onset   Obesity Mother    Diabetes Father  Hypertension Father    ADD / ADHD Brother    Rheum arthritis Maternal Grandmother    Breast cancer Neg Hx    Ovarian cancer Neg Hx    Colon cancer Neg Hx     Social History:  Social History   Socioeconomic History   Marital status: Significant Other    Spouse name: DOES NOT WANT TO GIVE NAME   Number of children: 0   Years of education: Not on file   Highest education level: High school graduate  Occupational History   Occupation: Server    Comment: [employer]   Occupation: Conservation officer, nature    Comment: [employer]   Occupation: Hosts    Comment: [employer]   Occupation:  NOT CURRENTLY EMPLOYEED  Tobacco Use   Smoking status: Former    Packs/day: .5    Types: Cigarettes   Smokeless tobacco: Former  Building services engineer Use: Former   Start date: 01/30/2018   Substances: Flavoring  Substance and Sexual Activity   Alcohol use: Not Currently    Comment: rare   Drug use: No   Sexual activity: Yes    Partners: Male    Birth control/protection: None  Other Topics Concern   Not on file  Social History Narrative   Not on file   Social Determinants of Health   Financial Resource Strain: Low Risk  (05/06/2022)   Overall Financial Resource Strain (CARDIA)    Difficulty of Paying Living Expenses: Not hard at all  Food Insecurity: No Food Insecurity (06/07/2022)   Hunger Vital Sign    Worried About Running Out of Food in the Last Year: Never true    Ran Out of Food in the Last Year: Never true  Transportation Needs: No Transportation Needs (05/13/2022)   PRAPARE - Administrator, Civil Service (Medical): No    Lack of Transportation (Non-Medical): No  Physical Activity: Insufficiently Active (05/06/2022)   Exercise Vital Sign    Days of Exercise per Week: 1 day    Minutes of Exercise per Session: 10 min  Stress: No Stress Concern Present (05/06/2022)   [employer] of Occupational Health - Occupational Stress Questionnaire    Feeling of Stress : Only a little  Social Connections: Socially Isolated (05/06/2022)   Social Connection and Isolation Panel [NHANES]    Frequency of Communication with Friends and Family: Twice a week    Frequency of Social Gatherings with Friends and Family: Three times a week    Attends Religious Services: Never    Active Member of Clubs or Organizations: No    Attends Banker Meetings: Never    Marital Status: Never married  Intimate Partner Violence: Not At Risk (06/07/2022)   Humiliation, Afraid, Rape, and Kick questionnaire    Fear of Current or Ex-Partner: No    Emotionally Abused: No    Physically  Abused: No    Sexually Abused: No    Allergies:  No Known Allergies  Medications: Prior to Admission medications   Medication Sig Start Date End Date Taking? Authorizing Provider  Prenatal Vit-Fe Fumarate-FA (PRENATAL MULTIVITAMIN) TABS tablet Take 1 tablet by mouth daily at 12 noon.   Yes [provider]    Physical Exam Vitals: Blood pressure 112/70, pulse 80, weight 237 lb 8 oz (107.7 kg), last menstrual period 04/12/2022.  General: NAD HEENT: normocephalic, anicteric Thyroid: no enlargement, no palpable nodules Pulmonary: No increased work of breathing, CTAB Cardiovascular: RRR, distal pulses  2+ Abdomen: NABS, soft, non-tender, non-distended.  Umbilicus without lesions.  No hepatomegaly, splenomegaly or masses palpable. No evidence of hernia  Genitourinary:  External: Normal external female genitalia.  Normal urethral meatus, normal  Bartholin's and Skene's glands.    Vagina: Normal vaginal mucosa, no evidence of prolapse.    Cervix: Grossly normal in appearance, bleeding after pap  Uterus: gravid, mobile, normal contour.  No CMT  Adnexa: ovaries non-enlarged, no adnexal masses  Rectal: deferred Extremities: no edema, erythema, or tenderness Neurologic: Grossly intact Psychiatric: mood appropriate, affect full   Assessment: 22 y.o. G1P0000 at [redacted]w[redacted]d presenting to initiate prenatal care  Plan: 1) Avoid alcoholic beverages. 2) Patient encouraged not to smoke.  3) Discontinue the use of all non-medicinal drugs and chemicals.  4) Take prenatal vitamins daily.  5) Nutrition, food safety (fish, cheese advisories, and high nitrite foods) and exercise discussed. 6) Hospital and practice style discussed with cross coverage system.  7) Genetic Screening, such as with 1st Trimester Screening, cell free fetal DNA, AFP testing, and Ultrasound, as well as with amniocentesis and CVS as appropriate, is discussed with patient. At the conclusion of today's visit patient results  reviewed genetic testing 8) Patient is asked about travel to areas at risk for the Zika virus, and counseled to avoid travel and exposure to mosquitoes or sexual partners who may have themselves been exposed to the virus. Testing is discussed, and will be ordered as appropriate.  Glucosuria noted with her urine- HgbA1C drawn today.The following were addressed during this visit:  Breastfeeding Education - Early initiation of breastfeeding    Comments: Keeps milk supply adequate, helps contract uterus and slow bleeding, and early milk is the perfect first food and is easy to digest.   - The importance of exclusive breastfeeding    Comments: Provides antibodies, Lower risk of breast and ovarian cancers, and type-2 diabetes,Helps your body recover, Reduced chance of SIDS.   - Risks of giving your baby anything other than breast milk if you are breastfeeding    Comments: Make the baby less content with breastfeeds, may make my baby more susceptible to illness, and may reduce my milk supply.   - Nonpharmacological pain relief methods for labor    Comments: Deep breathing, focusing on pleasant things, movement and walking, heating pads or cold compress, massage and relaxation, continuous support from someone you trust, and Doulas   - The importance of early skin-to-skin contact    Comments:  Keeps baby warm and secure, helps keep baby's blood sugar up and breathing steady, easier to bond and breastfeed, and helps calm baby.  - Rooming-in on a 24-hour basis    Comments: Easier to learn baby's feeding cues, easier to bond and get to know each other, and encourages milk production.   - Feeding on demand or baby-led feeding    Comments: Helps prevent breastfeeding complications, helps bring in good milk supply, prevents under or overfeeding, and helps baby feel content and satisfied   - Frequent feeding to help assure optimal milk production    Comments: Making a full supply of milk requires  frequent removal of milk from breasts, infant will eat 8-12 times in 24 hours, if separated from infant use breast massage, hand expression and/ or pumping to remove milk from breasts.   - Effective positioning and attachment    Comments: Helps my baby to get enough breast milk, helps to produce an adequate milk supply, and helps prevent nipple pain and damage   -  Exclusive breastfeeding for the first 6 months    Comments: Builds a healthy milk supply and keeps it up, protects baby from sickness and disease, and breastmilk has everything your baby needs for the first 6 months.  - Individualized Education    Comments: Contraindications to breastfeeding and other special medical conditions    Patient unable to provide urine sample today. Will need urine for culture.  Patient is transferring care to [facility] due to her moving there soon.  Return tomorrow 07/18/22 for urine test. Follow-up PRN til care established in [place].  Earlie Counts, SNM  Mirna Mires, CNM  07/17/2022 11:56 AM

## 2022-07-17 NOTE — Addendum Note (Signed)
Addended by: Fonda Kinder on: 07/17/2022 01:37 PM   Modules accepted: Orders

## 2022-07-18 ENCOUNTER — Other Ambulatory Visit: Payer: BC Managed Care – PPO

## 2022-07-18 ENCOUNTER — Encounter: Payer: Self-pay | Admitting: Obstetrics

## 2022-07-18 DIAGNOSIS — Z3491 Encounter for supervision of normal pregnancy, unspecified, first trimester: Secondary | ICD-10-CM | POA: Diagnosis not present

## 2022-07-18 LAB — HGB A1C W/O EAG: Hgb A1c MFr Bld: 5.3 % (ref 4.8–5.6)

## 2022-07-19 ENCOUNTER — Encounter: Payer: Self-pay | Admitting: Obstetrics

## 2022-07-19 LAB — CERVICOVAGINAL ANCILLARY ONLY
Bacterial Vaginitis (gardnerella): POSITIVE — AB
Candida Glabrata: NEGATIVE
Candida Vaginitis: NEGATIVE
Chlamydia: NEGATIVE
Comment: NEGATIVE
Comment: NEGATIVE
Comment: NEGATIVE
Comment: NEGATIVE
Comment: NEGATIVE
Comment: NORMAL
Neisseria Gonorrhea: NEGATIVE
Trichomonas: NEGATIVE

## 2022-07-19 LAB — CYTOLOGY - PAP

## 2022-07-20 LAB — MONITOR DRUG PROFILE 14(MW)
Amphetamine Scrn, Ur: NEGATIVE ng/mL
BARBITURATE SCREEN URINE: NEGATIVE ng/mL
BENZODIAZEPINE SCREEN, URINE: NEGATIVE ng/mL
Buprenorphine, Urine: NEGATIVE ng/mL
CANNABINOIDS UR QL SCN: NEGATIVE ng/mL
Cocaine (Metab) Scrn, Ur: NEGATIVE ng/mL
Creatinine(Crt), U: 128.1 mg/dL (ref 20.0–300.0)
Fentanyl, Urine: NEGATIVE pg/mL
Meperidine Screen, Urine: NEGATIVE ng/mL
Methadone Screen, Urine: NEGATIVE ng/mL
OXYCODONE+OXYMORPHONE UR QL SCN: NEGATIVE ng/mL
Opiate Scrn, Ur: NEGATIVE ng/mL
Ph of Urine: 7.3 (ref 4.5–8.9)
Phencyclidine Qn, Ur: NEGATIVE ng/mL
Propoxyphene Scrn, Ur: NEGATIVE ng/mL
SPECIFIC GRAVITY: 1.016
Tramadol Screen, Urine: NEGATIVE ng/mL

## 2022-07-20 LAB — NICOTINE SCREEN, URINE: Cotinine Ql Scrn, Ur: NEGATIVE ng/mL

## 2022-07-22 ENCOUNTER — Telehealth: Payer: Self-pay

## 2022-07-22 ENCOUNTER — Other Ambulatory Visit: Payer: Self-pay

## 2022-07-22 ENCOUNTER — Encounter: Payer: Self-pay | Admitting: Obstetrics

## 2022-07-22 DIAGNOSIS — N760 Acute vaginitis: Secondary | ICD-10-CM

## 2022-07-22 MED ORDER — METRONIDAZOLE 500 MG PO TABS: 500.0000 mg | ORAL_TABLET | Freq: Two times a day (BID) | ORAL | 0 refills | Status: DC

## 2022-07-22 NOTE — Telephone Encounter (Signed)
-----   Message from Mirna Mires, CNM sent at 07/22/2022  7:15 AM EDT ----- Regarding: LGSIL pap result on OB patient.- no MyChart HI. This gal has an LGSIL pap result. She needs to be notified, and as she is transferring to a [employer], needs to be informed, and to inform her [place] practice.. She does not have MyChart set up. Can you follks call her- her new providers may opt to set her up for colposcopy. ----- Message ----- From: Interface, [facility] Lab Results In Sent: 07/18/2022   5:36 AM EDT To: Mirna Mires, CNM

## 2022-07-22 NOTE — Telephone Encounter (Signed)
-----   Message from Margaret M Fryer, CNM sent at 07/22/2022  7:15 AM EDT ----- Regarding: LGSIL pap result on OB patient.- no MyChart HI. This gal has an LGSIL pap result. She needs to be notified, and as she is transferring to a [place] practice, needs to be informed, and to inform her [place] practice.. She does not have MyChart set up. Can you follks call her- her new providers may opt to set her up for colposcopy. ----- Message ----- From: Interface, [facility] Lab Results In Sent: 07/18/2022   5:36 AM EDT To: Margaret M Fryer, CNM   

## 2022-07-26 ENCOUNTER — Other Ambulatory Visit: Payer: Self-pay | Admitting: Obstetrics

## 2022-07-26 DIAGNOSIS — R87612 Low grade squamous intraepithelial lesion on cytologic smear of cervix (LGSIL): Secondary | ICD-10-CM

## 2022-07-26 DIAGNOSIS — Z3401 Encounter for supervision of normal first pregnancy, first trimester: Secondary | ICD-10-CM

## 2022-07-28 ENCOUNTER — Other Ambulatory Visit: Payer: Self-pay | Admitting: Obstetrics and Gynecology

## 2022-07-28 DIAGNOSIS — E282 Polycystic ovarian syndrome: Secondary | ICD-10-CM

## 2022-07-29 NOTE — Telephone Encounter (Signed)
Pt is pregnant and not currently using OCP's

## 2022-07-31 DIAGNOSIS — Z419 Encounter for procedure for purposes other than remedying health state, unspecified: Secondary | ICD-10-CM | POA: Diagnosis not present

## 2022-08-01 ENCOUNTER — Other Ambulatory Visit: Payer: Self-pay | Admitting: Obstetrics

## 2022-08-01 DIAGNOSIS — R87612 Low grade squamous intraepithelial lesion on cytologic smear of cervix (LGSIL): Secondary | ICD-10-CM

## 2022-08-01 NOTE — Progress Notes (Signed)
Discussed patient's pap smear result.  LGSIL.  Per consult with Dr. Logan Bores, we will set this up for her at approximately [redacted] weeks gestation. An order has been placed.  Mirna Mires, CNM  08/01/2022 11:20 PM

## 2022-08-07 NOTE — Telephone Encounter (Signed)
I have the patient scheduled for 6/7 with Dr. Valentino Saxon. The patient is going to call back to confirm with her work that she would be able to take that day off. The patient stated she will call back.

## 2022-08-14 ENCOUNTER — Other Ambulatory Visit: Payer: Self-pay

## 2022-08-14 ENCOUNTER — Telehealth: Payer: Self-pay | Admitting: Obstetrics and Gynecology

## 2022-08-14 DIAGNOSIS — Z3402 Encounter for supervision of normal first pregnancy, second trimester: Secondary | ICD-10-CM

## 2022-08-14 DIAGNOSIS — Z3689 Encounter for other specified antenatal screening: Secondary | ICD-10-CM

## 2022-08-14 NOTE — Telephone Encounter (Signed)
I contacted the patient via phone. She needed future ultrasound for anatomy. The patient is scheduled for 6/7 for Colposcopy appointment. I have added opening at 1 pm for this ultrasound. I left message for the patient to call back to confirm this date/ time to see if this works for the patient.

## 2022-08-14 NOTE — Progress Notes (Signed)
Anatomy scan ordered to schedule with next ROB apt 09/06/22

## 2022-08-14 NOTE — Telephone Encounter (Signed)
The patient called back and confirmed scheduled ultrasound add on for 09/06/22.

## 2022-08-16 ENCOUNTER — Ambulatory Visit (INDEPENDENT_AMBULATORY_CARE_PROVIDER_SITE_OTHER): Payer: BC Managed Care – PPO | Admitting: Obstetrics

## 2022-08-16 VITALS — BP 107/58 | HR 94 | Wt 239.1 lb

## 2022-08-16 DIAGNOSIS — Z3A18 18 weeks gestation of pregnancy: Secondary | ICD-10-CM

## 2022-08-16 DIAGNOSIS — Z3402 Encounter for supervision of normal first pregnancy, second trimester: Secondary | ICD-10-CM

## 2022-08-16 DIAGNOSIS — Z3401 Encounter for supervision of normal first pregnancy, first trimester: Secondary | ICD-10-CM

## 2022-08-16 LAB — POCT URINALYSIS DIPSTICK OB
Bilirubin, UA: NEGATIVE
Blood, UA: NEGATIVE
Glucose, UA: NEGATIVE
Ketones, UA: NEGATIVE
Leukocytes, UA: NEGATIVE
Nitrite, UA: NEGATIVE
POC,PROTEIN,UA: NEGATIVE
Spec Grav, UA: 1.015 (ref 1.010–1.025)
Urobilinogen, UA: 0.2 E.U./dL
pH, UA: 6 (ref 5.0–8.0)

## 2022-08-16 NOTE — Progress Notes (Signed)
Routine Prenatal Care Visit  Subjective  Maureen Gutierrez is a 22 y.o. G1P0000 at [redacted]w[redacted]d being seen today for ongoing prenatal care.  She is currently monitored for the following issues for this low-risk pregnancy and has Left ovarian cyst; Keratosis pilaris; Menorrhagia with regular cycle; Hidradenitis suppurativa; Tobacco dependence; Panic disorder; Chronic neutrophilia; and Encounter for supervision of normal first pregnancy in first trimester on their problem list.  ----------------------------------------------------------------------------------- Patient reports no complaints.    .  .   Maureen Gutierrez denies.  ----------------------------------------------------------------------------------- The following portions of the patient's history were reviewed and updated as appropriate: allergies, current medications, past family history, past medical history, past social history, past surgical history and problem list. Problem list updated.  Objective  Last menstrual period 04/12/2022. Pregravid weight Pregravid weight not on file Total Weight Gain Not found. Urinalysis: Urine Protein    Urine Glucose    Fetal Status:           General:  Alert, oriented and cooperative. Patient is in no acute distress.  Skin: Skin is warm and dry. No rash noted.   Cardiovascular: Normal heart rate noted  Respiratory: Normal respiratory effort, no problems with respiration noted  Abdomen: Soft, gravid, appropriate for gestational age.       Pelvic:  Cervical exam deferred        Extremities: Normal range of motion.     Mental Status: Normal mood and affect. Normal behavior. Normal judgment and thought content.   Assessment   22 y.o. G1P0000 at [redacted]w[redacted]d by  01/17/2023, by Last Menstrual Period presenting for routine prenatal visit  Plan   GI Problems (from 06/07/22 to present)     No problems associated with this episode.        Preterm labor symptoms and general obstetric precautions including but  not limited to vaginal bleeding, contractions, leaking of Gutierrez and fetal movement were reviewed in detail with the patient. Please refer to After Visit Summary for other counseling recommendations.   Return in about 4 weeks (around 09/13/2022) for return OB.  Maureen Gutierrez, CNM  08/16/2022 8:22 AM

## 2022-08-16 NOTE — Progress Notes (Signed)
Routine Prenatal Care Visit  Subjective  Maureen Gutierrez is a 22 y.o. G1P0000 at [redacted]w[redacted]d being seen today for ongoing prenatal care.  She is currently monitored for the following issues for this low-risk pregnancy and has Left ovarian cyst; Keratosis pilaris; Menorrhagia with regular cycle; Hidradenitis suppurativa; Tobacco dependence; Panic disorder; Chronic neutrophilia; and Encounter for supervision of normal first pregnancy in first trimester on their problem list.  ----------------------------------------------------------------------------------- Patient reports no complaints.  She has not yet recognized fetal movement. Has a colposcopy set. Contractions: Not present. Vag. Bleeding: None.  Movement: Absent. Leaking Fluid denies.  ----------------------------------------------------------------------------------- The following portions of the patient's history were reviewed and updated as appropriate: allergies, current medications, past family history, past medical history, past social history, past surgical history and problem list. Problem list updated.  Objective  Blood pressure (!) 107/58, pulse 94, weight 239 lb 1.6 oz (108.5 kg), last menstrual period 04/12/2022. Pregravid weight Pregravid weight not on file Total Weight Gain Not found. Urinalysis: Urine Protein Negative  Urine Glucose Negative  Fetal Status: Fetal Heart Rate (bpm): 147   Movement: Absent     General:  Alert, oriented and cooperative. Patient is in no acute distress.  Skin: Skin is warm and dry. No rash noted.   Cardiovascular: Normal heart rate noted  Respiratory: Normal respiratory effort, no problems with respiration noted  Abdomen: Soft, gravid, appropriate for gestational age. Pain/Pressure: Present     Pelvic:  Cervical exam deferred        Extremities: Normal range of motion.  Edema: None  Mental Status: Normal mood and affect. Normal behavior. Normal judgment and thought content.   Assessment   22 y.o.  G1P0000 at [redacted]w[redacted]d by  01/17/2023, by Last Menstrual Period presenting for routine prenatal visit  Plan   GI Problems (from 06/07/22 to present)     No problems associated with this episode.        Preterm labor symptoms and general obstetric precautions including but not limited to vaginal bleeding, contractions, leaking of fluid and fetal movement were reviewed in detail with the patient. Please refer to After Visit Summary for other counseling recommendations.  Her anatomy scan is scheduled. Discussed what fetal movement feels like.  Return in about 4 weeks (around 09/13/2022) for return OB.  Mirna Mires, CNM  08/16/2022 8:45 AM

## 2022-08-19 ENCOUNTER — Encounter: Payer: BC Managed Care – PPO | Admitting: Obstetrics & Gynecology

## 2022-08-19 ENCOUNTER — Encounter: Payer: Self-pay | Admitting: Obstetrics

## 2022-08-31 DIAGNOSIS — Z419 Encounter for procedure for purposes other than remedying health state, unspecified: Secondary | ICD-10-CM | POA: Diagnosis not present

## 2022-09-05 NOTE — Progress Notes (Deleted)
    GYNECOLOGY OFFICE COLPOSCOPY PROCEDURE NOTE  22 y.o. G1P0000 here for colposcopy for low-grade squamous intraepithelial neoplasia (LGSIL - encompassing HPV,mild dysplasia,CIN I) pap smear on 07/17/2022. Discussed role for HPV in cervical dysplasia, need for surveillance.  Patient gave informed written consent, time out was performed.  Placed in lithotomy position. Cervix viewed with speculum and colposcope after application of acetic acid.   Colposcopy adequate? {yes/no:20286}  {Findings; colposcopy:728}; corresponding biopsies obtained.  ECC specimen obtained. All specimens were labeled and sent to pathology.  Chaperone was present during entire procedure.  Patient was given post procedure instructions.  Will follow up pathology and manage accordingly; patient will be contacted with results and recommendations.  Routine preventative health maintenance measures emphasized.    Hildred Laser, MD [facility]

## 2022-09-06 ENCOUNTER — Ambulatory Visit: Payer: BC Managed Care – PPO | Admitting: Obstetrics and Gynecology

## 2022-09-06 ENCOUNTER — Ambulatory Visit (INDEPENDENT_AMBULATORY_CARE_PROVIDER_SITE_OTHER): Payer: BC Managed Care – PPO

## 2022-09-06 DIAGNOSIS — Z3689 Encounter for other specified antenatal screening: Secondary | ICD-10-CM | POA: Diagnosis not present

## 2022-09-06 DIAGNOSIS — Z3A21 21 weeks gestation of pregnancy: Secondary | ICD-10-CM

## 2022-09-06 DIAGNOSIS — Z3402 Encounter for supervision of normal first pregnancy, second trimester: Secondary | ICD-10-CM

## 2022-09-09 ENCOUNTER — Other Ambulatory Visit: Payer: Self-pay | Admitting: Obstetrics

## 2022-09-09 DIAGNOSIS — Z3401 Encounter for supervision of normal first pregnancy, first trimester: Secondary | ICD-10-CM

## 2022-09-12 ENCOUNTER — Encounter: Payer: Self-pay | Admitting: Certified Nurse Midwife

## 2022-09-12 ENCOUNTER — Telehealth: Payer: Self-pay | Admitting: Obstetrics and Gynecology

## 2022-09-12 ENCOUNTER — Ambulatory Visit (INDEPENDENT_AMBULATORY_CARE_PROVIDER_SITE_OTHER): Payer: BC Managed Care – PPO | Admitting: Certified Nurse Midwife

## 2022-09-12 VITALS — BP 98/67 | HR 80 | Wt 244.4 lb

## 2022-09-12 DIAGNOSIS — Z3A21 21 weeks gestation of pregnancy: Secondary | ICD-10-CM

## 2022-09-12 DIAGNOSIS — Z3402 Encounter for supervision of normal first pregnancy, second trimester: Secondary | ICD-10-CM

## 2022-09-12 LAB — POCT URINALYSIS DIPSTICK OB
Bilirubin, UA: NEGATIVE
Blood, UA: NEGATIVE
Glucose, UA: NEGATIVE
Ketones, UA: NEGATIVE
Leukocytes, UA: NEGATIVE
Nitrite, UA: NEGATIVE
POC,PROTEIN,UA: NEGATIVE
Spec Grav, UA: 1.01 (ref 1.010–1.025)
Urobilinogen, UA: 0.2 E.U./dL
pH, UA: 6.5 (ref 5.0–8.0)

## 2022-09-12 NOTE — Progress Notes (Signed)
ROB doing well, feeling good movement. Notes episode of getting light headed in the shower. Reassurance given. Discussed self help measures.   Has follow up u/s for completion of anatomy scan on 6/25. Pt encouraged to keep appointment.   Has appointment for colposcopy with Dr. Logan Bores scheduled.    Follow up 3 wk wks for u/s and ROB.   Doreene Burke, CNM

## 2022-09-12 NOTE — Patient Instructions (Signed)
Round Ligament Pain  The round ligaments are a pair of cord-like tissues that help support the uterus. They can become a source of pain during pregnancy as the ligaments soften and stretch as the baby grows. The pain usually begins in the second trimester (13-28 weeks) of pregnancy, and should only last for a few seconds when it occurs. However, the pain can come and go until the baby is delivered. The pain does not cause harm to the baby. Round ligament pain is usually a short, sharp, and pinching pain, but it can also be a dull, lingering, and aching pain. The pain is felt in the lower side of the abdomen or in the groin. It usually starts deep in the groin and moves up to the outside of the hip area. The pain may happen when you: Suddenly change position, such as quickly going from a sitting to standing position. Do physical activity. Cough or sneeze. Follow these instructions at home: Managing pain  When the pain starts, relax. Then, try any of these methods to help with the pain: Sit down. Flex your knees up to your abdomen. Lie on your side with one pillow under your abdomen and another pillow between your legs. Sit in a warm bath for 15-20 minutes or until the pain goes away. General instructions Watch your condition for any changes. Move slowly when you sit down or stand up. Stop or reduce your physical activities if they cause pain. Avoid long walks if they cause pain. Take over-the-counter and prescription medicines only as told by your health care provider. Keep all follow-up visits. This is important. Contact a health care provider if: Your pain does not go away with treatment. You feel pain in your back that you did not have before. Your medicine is not helping. You have a fever or chills. You have nausea or vomiting. You have diarrhea. You have pain when you urinate. Get help right away if: You have pain that is a rhythmic, cramping pain similar to labor pains. Labor  pains are usually 2 minutes apart, last for about 1 minute, and involve a bearing down feeling or pressure in your pelvis. You have vaginal bleeding. These symptoms may represent a serious problem that is an emergency. Do not wait to see if the symptoms will go away. Get medical help right away. Call your local emergency services (911 in the [place] Do not drive yourself to the hospital. Summary Round ligament pain is felt in the lower abdomen or groin. This pain usually begins in the second trimester (13-28 weeks) and should only last for a few seconds when it occurs. You may notice the pain when you suddenly change position, when you cough or sneeze, or during physical activity. Relaxing, flexing your knees to your abdomen, lying on one side, or taking a warm bath may help to get rid of the pain. Contact your health care provider if the pain does not go away. This information is not intended to replace advice given to you by your health care provider. Make sure you discuss any questions you have with your health care provider. Document Revised: 05/31/2020 Document Reviewed: 05/31/2020 Elsevier Patient Education  2024 [employer]  

## 2022-09-12 NOTE — Telephone Encounter (Signed)
The patient has questions about her colposcopy appointment scheduled for 7/16 with Dr. Logan Bores, I spoke (face to face) with Dr Logan Bores about the time frame of this appointment and his preference "is when the Ob patient is 22 weeks to do the Colposcopy". I advise (face to face) we have no openings to scheduled for the next couple of weeks and the patient would be about 27 weeks in her pregnancy on 7/16. Dr. Logan Bores gave a verbal consent it is better for the patient to have Colposcopy prior to that scheduling date another wise we need to see her after postpartum visit. I could see if there were any cancellations for this patient to be seen. I have re checked the schedule today and no opening to offer for the colposcopy appointment. I did contact the patient via phone. I advised the patient per Dr. Logan Bores that would need to schedule for this appointment for colposcopy within this week. The patient is aware we have at this time no openings to offer. The patient expressed "she will be starting a part time job with in the next week and if we were to call with cancellation it would be too late of notice to notify her job." She also expressed "her understanding with being okay to wait to schedule Colposcopy after postpartum care." " That she will speak with Dr. Valentino Saxon at her next visit on 10/02/22 about any questions or concerns she has about the Colposcopy.

## 2022-09-16 NOTE — Telephone Encounter (Signed)
I contacted the patient via phone. I left generic message for the patient to call back. Please advise Dr. Oretha Milch note about patient not needing an Colposcopy, just repeat pap in one year.

## 2022-09-16 NOTE — Telephone Encounter (Signed)
I spoke with the patient. I read Dr. Oretha Milch message word for word to the patient. She is states her understanding. I did advise again like I did before " if she has any questions, to write them down so she will not forget and review them at her next visit with Dr. Valentino Saxon". The patient expressed her gratitude.

## 2022-09-24 ENCOUNTER — Encounter: Payer: Self-pay | Admitting: Obstetrics

## 2022-09-24 ENCOUNTER — Ambulatory Visit (INDEPENDENT_AMBULATORY_CARE_PROVIDER_SITE_OTHER): Payer: BC Managed Care – PPO

## 2022-09-24 DIAGNOSIS — Z362 Encounter for other antenatal screening follow-up: Secondary | ICD-10-CM

## 2022-09-24 DIAGNOSIS — Z3401 Encounter for supervision of normal first pregnancy, first trimester: Secondary | ICD-10-CM

## 2022-09-24 DIAGNOSIS — Z3A23 23 weeks gestation of pregnancy: Secondary | ICD-10-CM | POA: Diagnosis not present

## 2022-09-27 ENCOUNTER — Encounter: Payer: BC Managed Care – PPO | Admitting: Licensed Practical Nurse

## 2022-09-30 DIAGNOSIS — Z419 Encounter for procedure for purposes other than remedying health state, unspecified: Secondary | ICD-10-CM | POA: Diagnosis not present

## 2022-10-02 ENCOUNTER — Encounter: Payer: Self-pay | Admitting: Obstetrics

## 2022-10-02 ENCOUNTER — Ambulatory Visit (INDEPENDENT_AMBULATORY_CARE_PROVIDER_SITE_OTHER): Payer: BC Managed Care – PPO | Admitting: Obstetrics

## 2022-10-02 VITALS — BP 104/66 | HR 86 | Wt 245.1 lb

## 2022-10-02 DIAGNOSIS — Z3402 Encounter for supervision of normal first pregnancy, second trimester: Secondary | ICD-10-CM

## 2022-10-02 DIAGNOSIS — Z3401 Encounter for supervision of normal first pregnancy, first trimester: Secondary | ICD-10-CM

## 2022-10-02 DIAGNOSIS — Z3A24 24 weeks gestation of pregnancy: Secondary | ICD-10-CM

## 2022-10-02 LAB — POCT URINALYSIS DIPSTICK OB
Bilirubin, UA: NEGATIVE
Blood, UA: NEGATIVE
Glucose, UA: NEGATIVE
Ketones, UA: NEGATIVE
Leukocytes, UA: NEGATIVE
Nitrite, UA: NEGATIVE
Spec Grav, UA: 1.025 (ref 1.010–1.025)
Urobilinogen, UA: 0.2 E.U./dL
pH, UA: 6 (ref 5.0–8.0)

## 2022-10-02 NOTE — Progress Notes (Signed)
ROB [redacted]w[redacted]d: She is doing well. Reports good fetal movement. No new concerns today.

## 2022-10-02 NOTE — Assessment & Plan Note (Signed)
-  Reviewed s/s of PTL and when to go to the hospital -Discussed 1-hour glucose at next visit -Plan colposcopy PP for LGSIL -Plans to transfer to [place]

## 2022-10-02 NOTE — Progress Notes (Signed)
    Return Prenatal Note   Assessment/Plan   Plan  22 y.o. G1P0000 at [redacted]w[redacted]d presents for follow-up OB visit. Reviewed prenatal record including previous visit note.  Encounter for supervision of normal first pregnancy in first trimester -Reviewed s/s of PTL and when to go to the hospital -Discussed 1-hour glucose at next visit -Plan colposcopy PP for LGSIL -Plans to transfer to [place]    Orders Placed This Encounter  Procedures   POC Urinalysis Dipstick OB   Return in about 4 weeks (around 10/30/2022).   Future Appointments  Date Time Provider Department Center  10/30/2022  2:40 PM [facility]OBGYN LAB AOB-AOB None  10/30/2022  3:15 PM Doreene Burke, CNM AOB-AOB None  11/19/2022 12:15 PM Agbuya, Ines Bloomer, DO PP-PIEDPED PP    For next visit:  ROB with 1 hour gluocla, third trimester labs, and Tdap     Subjective   21 y.o. G1P0000 at [redacted]w[redacted]d presents for this follow-up prenatal visit.  Maureen Gutierrez is feeling well and preparing for baby. She and her partner are taking CB classes and have BF classes scheduled. Reviewed comfort measures for sciatica. Discussed colpo. Plans to deliver in [place]. Will try to transfer care.  Patient reports: Movement: Present Contractions: Not present  Objective   Flow sheet Vitals: Pulse Rate: 86 BP: 104/66 Fundal Height: 26 cm Fetal Heart Rate (bpm): 147 Total weight gain: Not found.  General Appearance  No acute distress, well appearing, and well nourished Pulmonary   Normal work of breathing Neurologic   Alert and oriented to person, place, and time Psychiatric   Mood and affect within normal limits  Maureen Gutierrez, CNM  07/03/249:51 AM

## 2022-10-15 ENCOUNTER — Encounter: Payer: BC Managed Care – PPO | Admitting: Obstetrics and Gynecology

## 2022-10-15 ENCOUNTER — Ambulatory Visit: Payer: BC Managed Care – PPO | Admitting: Obstetrics and Gynecology

## 2022-10-23 ENCOUNTER — Encounter: Payer: Self-pay | Admitting: Obstetrics

## 2022-10-29 ENCOUNTER — Other Ambulatory Visit: Payer: Self-pay

## 2022-10-29 DIAGNOSIS — D649 Anemia, unspecified: Secondary | ICD-10-CM

## 2022-10-29 DIAGNOSIS — Z113 Encounter for screening for infections with a predominantly sexual mode of transmission: Secondary | ICD-10-CM

## 2022-10-29 DIAGNOSIS — Z131 Encounter for screening for diabetes mellitus: Secondary | ICD-10-CM

## 2022-10-30 ENCOUNTER — Ambulatory Visit: Payer: BC Managed Care – PPO | Admitting: Certified Nurse Midwife

## 2022-10-30 ENCOUNTER — Encounter: Payer: Self-pay | Admitting: Certified Nurse Midwife

## 2022-10-30 ENCOUNTER — Other Ambulatory Visit: Payer: BC Managed Care – PPO

## 2022-10-30 VITALS — BP 102/70 | HR 96 | Wt 248.6 lb

## 2022-10-30 DIAGNOSIS — Z113 Encounter for screening for infections with a predominantly sexual mode of transmission: Secondary | ICD-10-CM | POA: Diagnosis not present

## 2022-10-30 DIAGNOSIS — Z3A28 28 weeks gestation of pregnancy: Secondary | ICD-10-CM

## 2022-10-30 DIAGNOSIS — Z3483 Encounter for supervision of other normal pregnancy, third trimester: Secondary | ICD-10-CM

## 2022-10-30 DIAGNOSIS — D649 Anemia, unspecified: Secondary | ICD-10-CM | POA: Diagnosis not present

## 2022-10-30 DIAGNOSIS — Z131 Encounter for screening for diabetes mellitus: Secondary | ICD-10-CM | POA: Diagnosis not present

## 2022-10-30 LAB — POCT URINALYSIS DIPSTICK OB
Bilirubin, UA: NEGATIVE
Blood, UA: NEGATIVE
Ketones, UA: NEGATIVE
Nitrite, UA: NEGATIVE
POC,PROTEIN,UA: NEGATIVE
Spec Grav, UA: 1.015 (ref 1.010–1.025)
Urobilinogen, UA: 0.2 E.U./dL
pH, UA: 6 (ref 5.0–8.0)

## 2022-10-30 NOTE — Progress Notes (Signed)
ROB doing well. Feels good movement. 28 wk labs today: Glucose screen/RPR/CBC. Tdap - will due next visit , Blood transfusion consent completed, all questions answered. Sample birth plan given, will follow up in upcoming visits. Discussed birth control after delivery, information pamphlet given.   Follow up 2 wk  for ROB or sooner if needed.    Doreene Burke, CNM

## 2022-10-30 NOTE — Patient Instructions (Signed)
Diphtheria; Tetanus; Pertussis (DTaP or Tdap) Vaccine Injection What is this medication? DIPHTHERIA; TETANUS; PERTUSSIS VACCINE (dif THEER ee uh; TET n [place]; per TUS iss VAK seen) reduces the risk of diphtheria, tetanus (lockjaw), and pertussis (whooping cough). It does not treat diphtheria, tetanus, or pertussis. It is still possible to get diphtheria, tetanus, or pertussis after receiving this vaccine, but the symptoms may be less severe or not last as long. It works by helping your immune system learn how to fight off a future infection. This medicine may be used for other purposes; ask your health care provider or pharmacist if you have questions. COMMON BRAND NAME(S): Adacel, Boostrix, Certiva, Daptacel, Infanrix, Tripedia What should I tell my care team before I take this medication? They need to know if you have any of these conditions: Blood disorders, such as hemophilia Fever or infection Immune system problems Neurologic disease Seizures An unusual or allergic reaction to other vaccines, latex, other medications, foods, dyes, or preservatives Pregnant or trying to get pregnant Breastfeeding How should I use this medication? This vaccine is injected into a muscle. It is given by your care team. A copy of Vaccine Information Statements will be given before each vaccination. Be sure to read this information carefully each time. This sheet may change often. Talk to your care team about the use of this medication in children. While the DTaP vaccine may be given to children as young as 6 weeks and the Tdap vaccine may be given to children as young as 36 years old, precautions do apply. Overdosage: If you think you have taken too much of this medicine contact a poison control center or emergency room at once. NOTE: This medicine is only for you. Do not share this medicine with others. What if I miss a dose? It is important not to miss your dose. Call your care team if you are unable to keep an  appointment. What may interact with this medication? This medication may interact with the following: Certain medications that prevent or treat blood clots, such as warfarin, enoxaparin, dalteparin Immune globulin Medications that lower your chance of fighting an infection, such as adalimumab, anakinra, infliximab Medications to treat cancer Steroid medications, such as prednisone or cortisone This list may not describe all possible interactions. Give your health care provider a list of all the medicines, herbs, non-prescription drugs, or dietary supplements you use. Also tell them if you smoke, drink alcohol, or use illegal drugs. Some items may interact with your medicine. What should I watch for while using this medication? See your care team for all shots of this vaccine as directed. Report any side effects to your care team right away. This vaccine, like all vaccines, may not fully protect everyone. What side effects may I notice from receiving this medication? Side effects that you should report to your care team as soon as possible: Allergic reactions--skin rash, itching, hives, swelling of the face, lips, tongue, or throat Feeling faint or lightheaded Side effects that usually do not require medical attention (report these to your care team if they continue or are bothersome): Chills Fever General discomfort and fatigue Headache Joint pain Muscle pain Pain, redness, or irritation at injection site This list may not describe all possible side effects. Call your doctor for medical advice about side effects. You may report side effects to FDA at 1-800-FDA-1088. Where should I keep my medication? This vaccine is only given by your care team. It will not be stored at home. NOTE: This  sheet is a summary. It may not cover all possible information. If you have questions about this medicine, talk to your doctor, pharmacist, or health care provider.  2024 Elsevier/Gold Standard  (2021-09-17 00:00:00)

## 2022-10-31 ENCOUNTER — Encounter: Payer: Self-pay | Admitting: Certified Nurse Midwife

## 2022-10-31 DIAGNOSIS — Z419 Encounter for procedure for purposes other than remedying health state, unspecified: Secondary | ICD-10-CM | POA: Diagnosis not present

## 2022-10-31 NOTE — Telephone Encounter (Signed)
Hey can you call pt and discuss results. Pt is worried about all the high and low's.

## 2022-11-11 ENCOUNTER — Ambulatory Visit
Admission: EM | Admit: 2022-11-11 | Discharge: 2022-11-11 | Disposition: A | Discharge status: Home / Self Care | Payer: BC Managed Care – PPO | Attending: Internal Medicine | Admitting: Internal Medicine

## 2022-11-11 DIAGNOSIS — O98513 Other viral diseases complicating pregnancy, third trimester: Secondary | ICD-10-CM | POA: Insufficient documentation

## 2022-11-11 DIAGNOSIS — Z0189 Encounter for other specified special examinations: Secondary | ICD-10-CM

## 2022-11-11 DIAGNOSIS — Z3A30 30 weeks gestation of pregnancy: Secondary | ICD-10-CM | POA: Insufficient documentation

## 2022-11-11 DIAGNOSIS — R070 Pain in throat: Secondary | ICD-10-CM | POA: Insufficient documentation

## 2022-11-11 DIAGNOSIS — Z1152 Encounter for screening for COVID-19: Secondary | ICD-10-CM | POA: Diagnosis not present

## 2022-11-11 DIAGNOSIS — B349 Viral infection, unspecified: Secondary | ICD-10-CM | POA: Insufficient documentation

## 2022-11-11 DIAGNOSIS — Z20822 Contact with and (suspected) exposure to covid-19: Secondary | ICD-10-CM | POA: Diagnosis not present

## 2022-11-11 LAB — POCT RAPID STREP A (OFFICE): Rapid Strep A Screen: NEGATIVE

## 2022-11-11 MED ORDER — FLUTICASONE PROPIONATE 50 MCG/ACT NA SUSP: 2.0000 | Freq: Every day | NASAL | 12 refills | Status: DC

## 2022-11-11 MED ORDER — CETIRIZINE HCL 10 MG PO TABS: 10.0000 mg | ORAL_TABLET | Freq: Every day | ORAL | 0 refills | Status: DC

## 2022-11-11 NOTE — ED Triage Notes (Signed)
Pt reports itchy throat, cough, sneezing, runny nose x 3 days. Reports she was exposed to COVID 3 days ago.   Pt is [redacted] weeks pregnant.

## 2022-11-11 NOTE — ED Notes (Signed)
Pt requested COVID test

## 2022-11-11 NOTE — Discharge Instructions (Signed)
We will notify you of your test results as they arrive and may take between about 24 hours.  I encourage you to sign up for MyChart if you have not already done so as this can be the easiest way for [place] to communicate results to you online or through a phone app.  Generally, we only contact you if it is a positive test result.  In the meantime, if you develop worsening symptoms including fever, chest pain, shortness of breath despite our current treatment plan then please report to the emergency room as this may be a sign of worsening status from possible viral infection.  Otherwise, we will manage this as a viral syndrome. For sore throat or cough try using a honey-based tea. Use 3 teaspoons of honey with juice squeezed from half lemon. Place shaved pieces of ginger into 1/2-1 cup of water and warm over stove top. Then mix the ingredients and repeat every 4 hours as needed. Please take Tylenol 500mg -650mg  every 6 hours for aches and pains, fevers. Hydrate very well with at least 2 liters of water. Eat light meals such as soups to replenish electrolytes and soft fruits, veggies. Start an antihistamine like Zyrtec (10mg  daily) for postnasal drainage, sinus congestion.  You can take this together with Flonase a day as needed for the same kind of congestion.  Use Robitussin cough medications as needed. Can also use non-alcohol cough drops.

## 2022-11-11 NOTE — ED Provider Notes (Signed)
[facility]  Note:  This document was prepared using Conservation officer, historic buildings and may include unintentional dictation errors.  MRN: [id number] DOB: [birth year]  Subjective:   Maureen Gutierrez is a 22 y.o. female who is [redacted] weeks pregnant presenting for 3-day history of runny and stuffy nose, sneezing, coughing, scratchy and itchy throat.  Had exposure to COVID 3 days ago.  Would like to have respiratory testing.  No history of asthma or respiratory disorders.  No smoking of any kind.  No overt chest pain, shortness of breath or wheezing.  No current facility-administered medications for this encounter.  Current Outpatient Medications:    aspirin EC 81 MG tablet, Take 81 mg by mouth daily. Swallow whole., Disp: , Rfl:    Prenatal Vit-Fe Fumarate-FA (PRENATAL MULTIVITAMIN) TABS tablet, Take 1 tablet by mouth daily at 12 noon., Disp: , Rfl:    No Known Allergies  Past Medical History:  Diagnosis Date   Anxiety    Eczema    Heavy periods    Ovarian cyst    left - [name]   Painful menstrual periods      Past Surgical History:  Procedure Laterality Date   TONSILLECTOMY AND ADENOIDECTOMY      Family History  Problem Relation Age of Onset   Obesity Mother    Diabetes Father    Hypertension Father    ADD / ADHD Brother    Rheum arthritis Maternal Grandmother    Breast cancer Neg Hx    Ovarian cancer Neg Hx    Colon cancer Neg Hx     Social History   Tobacco Use   Smoking status: Former    Current packs/day: 0.50    Types: Cigarettes   Smokeless tobacco: Former  Building services engineer status: Former   Start date: 01/30/2018   Substances: Flavoring  Substance Use Topics   Alcohol use: Not Currently    Comment: rare   Drug use: No    ROS   Objective:   Vitals: BP 115/76 (BP Location: Right Arm)   Pulse (!) 114   Temp 99.1 F (37.3 C) (Oral)   Resp 18   LMP 04/12/2022 (Approximate)   SpO2 97%   Wt Readings  from Last 3 Encounters:  10/30/22 248 lb 9.6 oz (112.8 kg)  10/02/22 245 lb 1.6 oz (111.2 kg)  09/12/22 244 lb 6.4 oz (110.9 kg)   Temp Readings from Last 3 Encounters:  11/11/22 99.1 F (37.3 C) (Oral)  06/03/22 98.1 F (36.7 C) (Tympanic)  05/13/22 99 F (37.2 C) (Tympanic)   BP Readings from Last 3 Encounters:  11/11/22 115/76  10/30/22 102/70  10/02/22 104/66   Pulse Readings from Last 3 Encounters:  11/11/22 (!) 114  10/30/22 96  10/02/22 86   Physical Exam Constitutional:      General: She is not in acute distress.    Appearance: Normal appearance. She is well-developed and normal weight. She is not ill-appearing, toxic-appearing or diaphoretic.  HENT:     Head: Normocephalic and atraumatic.     Right Ear: Tympanic membrane, ear canal and external ear normal. No drainage or tenderness. No middle ear effusion. There is no impacted cerumen. Tympanic membrane is not erythematous or bulging.     Left Ear: Tympanic membrane, ear canal and external ear normal. No drainage or tenderness.  No middle ear effusion. There is no impacted cerumen. Tympanic membrane is not erythematous or bulging.  Nose: Nose normal. No congestion or rhinorrhea.     Mouth/Throat:     Mouth: Mucous membranes are moist. No oral lesions.     Pharynx: No pharyngeal swelling, oropharyngeal exudate, posterior oropharyngeal erythema or uvula swelling.     Tonsils: No tonsillar exudate or tonsillar abscesses. 0 on the right. 0 on the left.  Eyes:     General: No scleral icterus.       Right eye: No discharge.        Left eye: No discharge.     Extraocular Movements: Extraocular movements intact.     Right eye: Normal extraocular motion.     Left eye: Normal extraocular motion.     Conjunctiva/sclera: Conjunctivae normal.  Cardiovascular:     Rate and Rhythm: Normal rate and regular rhythm.     Heart sounds: Normal heart sounds. No murmur heard.    No friction rub. No gallop.  Pulmonary:      Effort: Pulmonary effort is normal. No respiratory distress.     Breath sounds: No stridor. No wheezing, rhonchi or rales.  Chest:     Chest wall: No tenderness.  Musculoskeletal:     Cervical back: Normal range of motion and neck supple.  Lymphadenopathy:     Cervical: No cervical adenopathy.  Skin:    General: Skin is warm and dry.  Neurological:     General: No focal deficit present.     Mental Status: She is alert and oriented to person, place, and time.  Psychiatric:        Mood and Affect: Mood normal.        Behavior: Behavior normal.     Results for orders placed or performed during the hospital encounter of 11/11/22 (from the past 24 hour(s))  POCT rapid strep A     Status: None   Collection Time: 11/11/22 12:54 PM  Result Value Ref Range   Rapid Strep A Screen Negative Negative    Assessment and Plan :   PDMP not reviewed this encounter.  1. Acute viral syndrome   2. Patient requested diagnostic testing   3. Viral disease in pregnancy, third trimester   4. Throat pain     Will defer imaging given her pregnancy and clear pulmonary exam.  Will manage for viral illness such as viral URI, viral syndrome, viral rhinitis, COVID-19, viral pharyngitis. Recommended supportive care. Offered scripts for symptomatic relief. COVID 19 and strep culture are pending. Counseled patient on potential for adverse effects with medications prescribed/recommended today, ER and return-to-clinic precautions discussed, patient verbalized understanding.   Patient should be considered for Paxlovid treatment should she test positive for COVID-19.   Wallis Bamberg, [place] 11/12/22 [phone]

## 2022-11-12 ENCOUNTER — Telehealth: Payer: Self-pay

## 2022-11-12 MED ORDER — PAXLOVID (300/100) 20 X 150 MG & 10 X 100MG PO TBPK: 3.0000 | ORAL_TABLET | Freq: Two times a day (BID) | ORAL | 0 refills | Status: AC

## 2022-11-12 NOTE — Telephone Encounter (Signed)
Discussed positive COVID results with Amela. She has been prescribed Paxlovid but has not been able to pick it up yet. She is afebrile and able to eat and drink. Reviewed self-care and when to seek medical attention.  Glenetta Borg, CNM

## 2022-11-12 NOTE — Telephone Encounter (Signed)
Pt calling about results from yesterday.  [phone]

## 2022-11-12 NOTE — Telephone Encounter (Signed)
Pt with +covid test-chart reviewed my Mani, PA-C-orders to send full dose paxlovid

## 2022-11-15 ENCOUNTER — Encounter: Payer: BC Managed Care – PPO | Admitting: Licensed Practical Nurse

## 2022-11-18 ENCOUNTER — Ambulatory Visit: Payer: BC Managed Care – PPO | Admitting: Certified Nurse Midwife

## 2022-11-18 ENCOUNTER — Encounter: Payer: Self-pay | Admitting: Certified Nurse Midwife

## 2022-11-18 VITALS — BP 123/85 | HR 92 | Wt 249.0 lb

## 2022-11-18 DIAGNOSIS — Z3A31 31 weeks gestation of pregnancy: Secondary | ICD-10-CM

## 2022-11-18 DIAGNOSIS — O98513 Other viral diseases complicating pregnancy, third trimester: Secondary | ICD-10-CM

## 2022-11-18 DIAGNOSIS — Z3483 Encounter for supervision of other normal pregnancy, third trimester: Secondary | ICD-10-CM

## 2022-11-18 DIAGNOSIS — U071 COVID-19: Secondary | ICD-10-CM

## 2022-11-18 DIAGNOSIS — Z3689 Encounter for other specified antenatal screening: Secondary | ICD-10-CM

## 2022-11-18 LAB — POCT URINALYSIS DIPSTICK OB
Bilirubin, UA: NEGATIVE
Blood, UA: NEGATIVE
Glucose, UA: NEGATIVE
Ketones, UA: NEGATIVE
Leukocytes, UA: NEGATIVE
Nitrite, UA: NEGATIVE
POC,PROTEIN,UA: NEGATIVE
Spec Grav, UA: 1.02 (ref 1.010–1.025)
Urobilinogen, UA: 0.2 E.U./dL
pH, UA: 6 (ref 5.0–8.0)

## 2022-11-18 NOTE — Progress Notes (Signed)
    Return Prenatal Note   Subjective   22 y.o. G1P0000 at [redacted]w[redacted]d presents for this follow-up prenatal visit.  Patient completed Paxlovid after COVID infection. Reports feeling much better, mostly upper respiratory symptoms. Weight gain reviewed. Plans to deliver in [place], has attempted to transfer prenatal care without success. Patient reports: Movement: Present Contractions: Not present  Objective   Flow sheet Vitals: Pulse Rate: 92 BP: 123/85 Fundal Height: 32 cm Fetal Heart Rate (bpm): 140 Total weight gain: 5 lb (2.268 kg)  General Appearance  No acute distress, well appearing, and well nourished Pulmonary   Normal work of breathing Neurologic   Alert and oriented to person, place, and time Psychiatric   Mood and affect within normal limits  Assessment/Plan   Plan  22 y.o. G1P0000 at [redacted]w[redacted]d presents for follow-up OB visit. Reviewed prenatal record including previous visit note. 1. Encounter for ultrasound to assess fetal growth - US OB Follow Up; Future  2. COVID-19 affecting pregnancy in third trimester - US OB Follow Up; Future  3. Encounter for supervision of other normal pregnancy in third trimester  4. [redacted] weeks gestation of pregnancy    Orders Placed This Encounter  Procedures   US OB Follow Up    Standing Status:   Future    Standing Expiration Date:   11/18/2023    Order Specific Question:   Reason for exam:    Answer:   follow up fetal growth: obesity, COVID infection in pregnancy (11/11/22)    Order Specific Question:   Preferred imaging location?    Answer:   [facility]   Return in 2 weeks (on 12/02/2022) for ROB.   Future Appointments  Date Time Provider Department Center  11/19/2022 12:15 PM Myles Gip, [place] PP-PIEDPED [facility]  11/29/2022  3:15 PM Dominica Severin, CNM AOB-AOB None  12/13/2022  3:15 PM Glenetta Borg, CNM AOB-AOB None  12/27/2022  3:15 PM Dominica Severin, CNM AOB-AOB None    For next visit:  continue  with routine prenatal care     Dominica Severin, CNM  08/19/243:49 PM

## 2022-11-18 NOTE — Addendum Note (Signed)
Addended by: Loney Laurence on: 11/18/2022 04:00 PM   Modules accepted: Orders

## 2022-11-18 NOTE — Patient Instructions (Signed)

## 2022-11-19 ENCOUNTER — Ambulatory Visit (INDEPENDENT_AMBULATORY_CARE_PROVIDER_SITE_OTHER): Payer: Self-pay | Admitting: Pediatrics

## 2022-11-19 DIAGNOSIS — Z7681 Expectant parent(s) prebirth pediatrician visit: Secondary | ICD-10-CM

## 2022-11-24 DIAGNOSIS — Z3483 Encounter for supervision of other normal pregnancy, third trimester: Secondary | ICD-10-CM | POA: Diagnosis not present

## 2022-11-24 DIAGNOSIS — Z3482 Encounter for supervision of other normal pregnancy, second trimester: Secondary | ICD-10-CM | POA: Diagnosis not present

## 2022-11-27 ENCOUNTER — Encounter: Payer: Self-pay | Admitting: Pediatrics

## 2022-11-27 NOTE — Progress Notes (Signed)
Prenatal counseling for impending newborn done--  Reviewed current vaccine policy and answered all questions.  1st child, Currently 31 weeks, Current complications:  none, Prenatal care initiated:  early Z76.81

## 2022-11-29 ENCOUNTER — Ambulatory Visit (INDEPENDENT_AMBULATORY_CARE_PROVIDER_SITE_OTHER): Payer: BC Managed Care – PPO | Admitting: Certified Nurse Midwife

## 2022-11-29 VITALS — BP 105/72 | HR 97 | Wt 254.0 lb

## 2022-11-29 DIAGNOSIS — Z23 Encounter for immunization: Secondary | ICD-10-CM | POA: Diagnosis not present

## 2022-11-29 DIAGNOSIS — Z3401 Encounter for supervision of normal first pregnancy, first trimester: Secondary | ICD-10-CM

## 2022-11-29 DIAGNOSIS — Z3403 Encounter for supervision of normal first pregnancy, third trimester: Secondary | ICD-10-CM

## 2022-11-29 DIAGNOSIS — Z3A33 33 weeks gestation of pregnancy: Secondary | ICD-10-CM

## 2022-11-29 NOTE — Progress Notes (Unsigned)
    Return Prenatal Note   Subjective   22 y.o. G1P0000 at [redacted]w[redacted]d presents for this follow-up prenatal visit.  Patient feeling well, received breast pump, wondering about sizing of flanges. Starting new job on Sunday, would like to reschedule ultrasound for growth (recommended due to COVID infection in 3rd trimester). Patient reports: Movement: Present Contractions: Irritability  Objective   Flow sheet Vitals: Pulse Rate: 97 BP: 105/72 Fundal Height: 34 cm Fetal Heart Rate (bpm): 150 Total weight gain: 10 lb (4.536 kg)  General Appearance  No acute distress, well appearing, and well nourished Pulmonary   Normal work of breathing Neurologic   Alert and oriented to person, place, and time Psychiatric   Mood and affect within normal limits  Assessment/Plan   Plan  22 y.o. G1P0000 at [redacted]w[redacted]d presents for follow-up OB visit. Reviewed prenatal record including previous visit note. 1. Need for diphtheria-tetanus-pertussis (Tdap) vaccine - Tdap vaccine greater than or equal to 7yo IM  2. Encounter for supervision of normal first pregnancy in third trimester  Flange sizing for breast pump reviewed. Ok to reschedule ultrasound to closer to 35-36w. Plans to deliver in [place].  Orders Placed This Encounter  Procedures   Tdap vaccine greater than or equal to 7yo IM   Return in 2 weeks (on 12/13/2022) for ROB.   Future Appointments  Date Time Provider Department Center  12/03/2022  3:00 PM AOB-AOB [place] 1 AOB-IMG None  12/13/2022  3:15 PM Guadlupe Spanish M, CNM AOB-AOB None  12/19/2022  2:35 PM Glenetta Borg, CNM AOB-AOB None  12/27/2022  3:15 PM Dominica Severin, CNM AOB-AOB None  01/03/2023  2:35 PM Doreene Burke, CNM AOB-AOB None  01/10/2023  2:35 PM Free, Lindalou Hose, CNM AOB-AOB None  01/17/2023  2:35 PM Dominic, Courtney Heys, CNM AOB-AOB None    For next visit:  continue with routine prenatal care     Dominica Severin, CNM

## 2022-11-29 NOTE — Patient Instructions (Signed)

## 2022-12-01 DIAGNOSIS — Z419 Encounter for procedure for purposes other than remedying health state, unspecified: Secondary | ICD-10-CM | POA: Diagnosis not present

## 2022-12-02 ENCOUNTER — Encounter: Payer: Self-pay | Admitting: Certified Nurse Midwife

## 2022-12-03 ENCOUNTER — Ambulatory Visit (INDEPENDENT_AMBULATORY_CARE_PROVIDER_SITE_OTHER): Payer: BC Managed Care – PPO

## 2022-12-03 DIAGNOSIS — Z3A34 34 weeks gestation of pregnancy: Secondary | ICD-10-CM | POA: Diagnosis not present

## 2022-12-03 DIAGNOSIS — U071 COVID-19: Secondary | ICD-10-CM | POA: Diagnosis not present

## 2022-12-03 DIAGNOSIS — O98513 Other viral diseases complicating pregnancy, third trimester: Secondary | ICD-10-CM | POA: Diagnosis not present

## 2022-12-03 DIAGNOSIS — Z3689 Encounter for other specified antenatal screening: Secondary | ICD-10-CM

## 2022-12-04 ENCOUNTER — Other Ambulatory Visit: Payer: Self-pay

## 2022-12-13 ENCOUNTER — Ambulatory Visit (INDEPENDENT_AMBULATORY_CARE_PROVIDER_SITE_OTHER): Payer: BC Managed Care – PPO | Admitting: Obstetrics

## 2022-12-13 ENCOUNTER — Encounter: Payer: Self-pay | Admitting: Obstetrics

## 2022-12-13 VITALS — BP 129/85 | HR 94 | Wt 259.0 lb

## 2022-12-13 DIAGNOSIS — Z113 Encounter for screening for infections with a predominantly sexual mode of transmission: Secondary | ICD-10-CM

## 2022-12-13 DIAGNOSIS — Z3685 Encounter for antenatal screening for Streptococcus B: Secondary | ICD-10-CM

## 2022-12-13 DIAGNOSIS — Z6841 Body Mass Index (BMI) 40.0 and over, adult: Secondary | ICD-10-CM | POA: Insufficient documentation

## 2022-12-13 DIAGNOSIS — Z3480 Encounter for supervision of other normal pregnancy, unspecified trimester: Secondary | ICD-10-CM

## 2022-12-13 DIAGNOSIS — Z3403 Encounter for supervision of normal first pregnancy, third trimester: Secondary | ICD-10-CM

## 2022-12-13 DIAGNOSIS — Z3A35 35 weeks gestation of pregnancy: Secondary | ICD-10-CM

## 2022-12-13 LAB — POCT URINALYSIS DIPSTICK OB
Bilirubin, UA: NEGATIVE
Blood, UA: NEGATIVE
Glucose, UA: NEGATIVE
Ketones, UA: NEGATIVE
Leukocytes, UA: NEGATIVE
Nitrite, UA: NEGATIVE
POC,PROTEIN,UA: NEGATIVE
Spec Grav, UA: 1.02 (ref 1.010–1.025)
Urobilinogen, UA: 0.2 U/dL
pH, UA: 6.5 (ref 5.0–8.0)

## 2022-12-13 NOTE — Addendum Note (Signed)
Addended by: Loney Laurence on: 12/13/2022 04:47 PM   Modules accepted: Orders

## 2022-12-13 NOTE — Assessment & Plan Note (Signed)
-  Weekly NSTs starting at 36 weeks -Discussed birth by EDD. Maureen Gutierrez is in agreement. She states that if she has an IOL scheduled at [facility], she will go that but plans to go to [place] if she labors spontaneously. Advised that she likely has time to make it [facility] even if she does labor spontaneously.

## 2022-12-13 NOTE — Assessment & Plan Note (Addendum)
-  Reviewed s/s of labor, normal fetal movement, and when to go to the hospital -Discussed GBS and GC/chlamydia swabs at next visit -Ready for birth: CBE completed, pediatrician selected, car seat ready -Discussed sleep hygiene, Unisom/Benadryl prn

## 2022-12-13 NOTE — Progress Notes (Signed)
    Return Prenatal Note   Assessment/Plan   Plan  22 y.o. G1P0000 at [redacted]w[redacted]d presents for follow-up OB visit. Reviewed prenatal record including previous visit note.  BMI 40.0-44.9, adult (HCC) -Weekly NSTs starting at 36 weeks -Discussed birth by EDD. Chantell is in agreement. She states that if she has an IOL scheduled at [facility], she will go that but plans to go to [place] if she labors spontaneously. Advised that she likely has time to make it [facility] even if she does labor spontaneously.  Encounter for supervision of normal first pregnancy in third trimester -Reviewed s/s of labor, normal fetal movement, and when to go to the hospital -Discussed GBS and GC/chlamydia swabs at next visit -Ready for birth: CBE completed, pediatrician selected, car seat ready -Discussed sleep hygiene, Unisom/Benadryl prn   Orders Placed This Encounter  Procedures   Strep Gp B NAA    Standing Status:   Future    Standing Expiration Date:   12/13/2023   Return in about 1 week (around 12/20/2022).   Future Appointments  Date Time Provider Department Center  12/20/2022 10:15 AM AOB-NST ROOM AOB-AOB None  12/20/2022 10:55 AM Dominic, Courtney Heys, CNM AOB-AOB None  12/27/2022  2:15 PM AOB-NST ROOM AOB-AOB None  12/27/2022  3:15 PM Hartley Barefoot L, CNM AOB-AOB None  01/03/2023  1:15 PM AOB-NST ROOM AOB-AOB None  01/03/2023  2:35 PM Doreene Burke, CNM AOB-AOB None  01/10/2023  1:15 PM AOB-NST ROOM AOB-AOB None  01/10/2023  2:35 PM Free, Lindalou Hose, CNM AOB-AOB None  01/17/2023  1:15 PM AOB-NST ROOM AOB-AOB None  01/17/2023  2:35 PM Dominic, Courtney Heys, CNM AOB-AOB None    For next visit:  ROB with GBS screening      Subjective   Makailyn is feeling ready for the baby. She is having some BH ctx but nothing consistent. She is not sleeping well at night but takes naps  during the day.  Movement: Present Contractions: Not present  Objective   Flow sheet Vitals: Pulse Rate: 94 BP: 129/85 Fundal  Height: 37 cm Fetal Heart Rate (bpm): 133 Total weight gain: 15 lb (6.804 kg)  General Appearance  No acute distress, well appearing, and well nourished Pulmonary   Normal work of breathing Neurologic   Alert and oriented to person, place, and time Psychiatric   Mood and affect within normal limits  Guadlupe Spanish, CNM 12/13/22 3:37 PM

## 2022-12-15 ENCOUNTER — Other Ambulatory Visit: Payer: Self-pay

## 2022-12-15 ENCOUNTER — Inpatient Hospital Stay (HOSPITAL_COMMUNITY)
Admission: AD | Admit: 2022-12-15 | Discharge: 2022-12-15 | Disposition: A | Discharge status: Home / Self Care | Payer: BC Managed Care – PPO | Attending: Obstetrics and Gynecology | Admitting: Obstetrics and Gynecology

## 2022-12-15 ENCOUNTER — Encounter (HOSPITAL_COMMUNITY): Payer: Self-pay | Admitting: Obstetrics and Gynecology

## 2022-12-15 DIAGNOSIS — K219 Gastro-esophageal reflux disease without esophagitis: Secondary | ICD-10-CM | POA: Diagnosis not present

## 2022-12-15 DIAGNOSIS — Z3A35 35 weeks gestation of pregnancy: Secondary | ICD-10-CM | POA: Diagnosis not present

## 2022-12-15 DIAGNOSIS — O26893 Other specified pregnancy related conditions, third trimester: Secondary | ICD-10-CM | POA: Diagnosis not present

## 2022-12-15 DIAGNOSIS — R531 Weakness: Secondary | ICD-10-CM | POA: Insufficient documentation

## 2022-12-15 DIAGNOSIS — O212 Late vomiting of pregnancy: Secondary | ICD-10-CM | POA: Diagnosis not present

## 2022-12-15 DIAGNOSIS — O99613 Diseases of the digestive system complicating pregnancy, third trimester: Secondary | ICD-10-CM | POA: Insufficient documentation

## 2022-12-15 DIAGNOSIS — O219 Vomiting of pregnancy, unspecified: Secondary | ICD-10-CM

## 2022-12-15 LAB — COMPREHENSIVE METABOLIC PANEL
ALT: 22 U/L (ref 0–44)
AST: 15 U/L (ref 15–41)
Albumin: 2.8 g/dL — ABNORMAL LOW (ref 3.5–5.0)
Alkaline Phosphatase: 68 U/L (ref 38–126)
Anion gap: 12 (ref 5–15)
BUN: 5 mg/dL — ABNORMAL LOW (ref 6–20)
CO2: 19 mmol/L — ABNORMAL LOW (ref 22–32)
Calcium: 8.7 mg/dL — ABNORMAL LOW (ref 8.9–10.3)
Chloride: 105 mmol/L (ref 98–111)
Creatinine, Ser: 0.44 mg/dL (ref 0.44–1.00)
GFR, Estimated: >60 mL/min (ref >60)
Glucose, Bld: 101 mg/dL — ABNORMAL HIGH (ref 70–99)
Potassium: 4.2 mmol/L (ref 3.5–5.1)
Sodium: 136 mmol/L (ref 135–145)
Total Bilirubin: 0.6 mg/dL (ref 0.3–1.2)
Total Protein: 6.5 g/dL (ref 6.5–8.1)

## 2022-12-15 LAB — URINALYSIS, ROUTINE W REFLEX MICROSCOPIC
Glucose, UA: NEGATIVE mg/dL
Hgb urine dipstick: NEGATIVE
Ketones, ur: NEGATIVE mg/dL
Nitrite: NEGATIVE
Protein, ur: NEGATIVE mg/dL
Specific Gravity, Urine: 1.02 (ref 1.005–1.030)
pH: 7 (ref 5.0–8.0)

## 2022-12-15 LAB — CBC
HCT: 33.2 % — ABNORMAL LOW (ref 36.0–46.0)
Hemoglobin: 11 g/dL — ABNORMAL LOW (ref 12.0–15.0)
MCH: 29.7 pg (ref 26.0–34.0)
MCHC: 33.1 g/dL (ref 30.0–36.0)
MCV: 89.7 fL (ref 80.0–100.0)
Platelets: 299 10*3/uL (ref 150–400)
RBC: 3.7 MIL/uL — ABNORMAL LOW (ref 3.87–5.11)
RDW: 13.1 % (ref 11.5–15.5)
WBC: 12.9 10*3/uL — ABNORMAL HIGH (ref 4.0–10.5)
nRBC: 0 % (ref 0.0–0.2)

## 2022-12-15 LAB — URINALYSIS, MICROSCOPIC (REFLEX): Bacteria, UA: NONE SEEN

## 2022-12-15 LAB — PROTEIN / CREATININE RATIO, URINE
Creatinine, Urine: 174 mg/dL
Protein Creatinine Ratio: 0.1 mg/mg{creat} (ref 0.00–0.15)
Total Protein, Urine: 18 mg/dL

## 2022-12-15 MED ORDER — ONDANSETRON 4 MG PO TBDP
8.0000 mg | ORAL_TABLET | Freq: Once | ORAL | Status: AC
  Administered 2022-12-15: 8 mg via ORAL
  Filled 2022-12-15: qty 2

## 2022-12-15 MED ORDER — ONDANSETRON HCL 4 MG PO TABS: 4.0000 mg | ORAL_TABLET | Freq: Three times a day (TID) | ORAL | 0 refills | Status: DC | PRN

## 2022-12-15 MED ORDER — FAMOTIDINE 20 MG PO TABS: 20.0000 mg | ORAL_TABLET | Freq: Two times a day (BID) | ORAL | 0 refills | Status: DC | PRN

## 2022-12-15 MED ORDER — FAMOTIDINE 20 MG PO TABS
20.0000 mg | ORAL_TABLET | ORAL | Status: AC
  Administered 2022-12-15: 20 mg via ORAL
  Filled 2022-12-15: qty 1

## 2022-12-15 MED ORDER — OMEPRAZOLE MAGNESIUM 20 MG PO TBEC: 20.0000 mg | DELAYED_RELEASE_TABLET | Freq: Every day | ORAL | 0 refills | Status: DC

## 2022-12-15 NOTE — MAU Note (Signed)
Maureen Gutierrez is a 22 y.o. at [redacted]w[redacted]d here in MAU reporting: she had blood in vomit once this morning after taking morning meds.  Also reports had urinary urgency last night, denies frequency, pain, or burning with urination.  Denies VB or LOF.  Reports +FM. LMP: NA Onset of complaint: today Pain score: 0 Vitals:   12/15/22 1132  BP: 126/82  Pulse: (!) 102  Resp: 18  Temp: 97.7 F (36.5 C)  SpO2: 99%     FHT:164 bpm Lab orders placed from triage:   UA

## 2022-12-15 NOTE — MAU Provider Note (Signed)
History     CSN: [id number]  Arrival date and time: 12/15/22 1113   Event Date/Time   First Provider Initiated Contact with Patient 12/15/22 1201      Chief Complaint  Patient presents with   Blood in Vomit   HPI Maureen Gutierrez is a 22 y.o. G1P0000 at [redacted]w[redacted]d who presents with complaints of blood in emesis and isolated incident of urinary urgency. Had episode of urinary urgency yesterday where she had difficulty voiding. On subsequent voids last night, she was able to fully empty her bladder. No dysuria, urinary frequency. No fevers, chills, mid back pain.  She also reports ongoing issues with GERD in pregnancy. Not currently on any meds. She had an episode of nausea with vomiting earlier today when she began to vomit repeatedly. There was only one total instance of vomiting, but while she was retching she vomited up some blood. Per pt's account, mucus and blood, no clots or coffee ground emesis. She felt weak and tired after this episode. Had isolated episode of nausea with vomiting earlier in pregnancy, but no h/o hyperemesis.  No VB, LOF, ctx. Has good FM.  Past Medical History:  Diagnosis Date   Anxiety    Eczema    Heavy periods    Menorrhagia with regular cycle 02/16/2018   Ovarian cyst    left - [name]   Painful menstrual periods     Past Surgical History:  Procedure Laterality Date   TONSILLECTOMY AND ADENOIDECTOMY      Family History  Problem Relation Age of Onset   Obesity Mother    Diabetes Father    Hypertension Father    ADD / ADHD Brother    Rheum arthritis Maternal Grandmother    Breast cancer Neg Hx    Ovarian cancer Neg Hx    Colon cancer Neg Hx     Social History   Tobacco Use   Smoking status: Former    Current packs/day: 0.50    Types: Cigarettes   Smokeless tobacco: Former  Building services engineer status: Former   Start date: 01/30/2018   Substances: Flavoring  Substance Use Topics   Alcohol use: Not Currently     Comment: rare   Drug use: No    Allergies: No Known Allergies  Medications Prior to Admission  Medication Sig Dispense Refill Last Dose   aspirin EC 81 MG tablet Take 81 mg by mouth daily. Swallow whole.   12/15/2022   cetirizine (ZYRTEC ALLERGY) 10 MG tablet Take 1 tablet (10 mg total) by mouth daily. 30 tablet 0 12/15/2022   ferrous sulfate 325 (65 FE) MG EC tablet Take 325 mg by mouth 3 (three) times daily with meals.   12/15/2022   Prenatal Vit-Fe Fumarate-FA (PRENATAL MULTIVITAMIN) TABS tablet Take 1 tablet by mouth daily at 12 noon.   12/15/2022   ROS reviewed with patient, pertinent positives and negatives as documented in HPI. Physical Exam   Blood pressure 126/82, pulse (!) 102, temperature 97.7 F (36.5 C), temperature source Oral, resp. rate 18, height 5\' 8"  (1.727 m), weight 116.9 kg, last menstrual period 04/12/2022, SpO2 99%.  Physical Exam Constitutional:      General: She is not in acute distress.    Appearance: Normal appearance. She is not ill-appearing.  HENT:     Head: Normocephalic and atraumatic.  Cardiovascular:     Rate and Rhythm: Normal rate and regular rhythm.     Heart sounds: Normal heart sounds.  Pulmonary:  Effort: Pulmonary effort is normal. No respiratory distress.     Breath sounds: Normal breath sounds.  Abdominal:     General: There is no distension.     Palpations: Abdomen is soft.     Tenderness: There is no abdominal tenderness. There is no right CVA tenderness, left CVA tenderness or guarding.  Musculoskeletal:        General: Normal range of motion.  Skin:    General: Skin is warm and dry.     Findings: No rash.  Neurological:     General: No focal deficit present.     Mental Status: She is alert and oriented to person, place, and time.  Psychiatric:        Mood and Affect: Mood normal.        Behavior: Behavior normal.   EFM: 135/mod/+a/-d  MAU Course  Procedures  MDM 22 y.o. G1P0000 at [redacted]w[redacted]d who presents with nausea and  vomiting in setting of known GERD. She has not been on any medication to manage reflux during her pregnancy, but reports worsening of sxs with new episode of nausea/vomiting with blood streaked emesis. Based on hx, suspect likely Mallory Weiss tear as patient otherwise stable and exam not concerning for Boerhaave tear. She is hemodynamically stable and exam is benign. Will order labs to eval electrolytes and HgB given c/o weakness in setting of blood tinged emesis, to r/o (though much less likely) hepatobiliary pathology vs atypical presentation of pre-e vs HELLP. Giving Pepcid, Zofran in interim to assess symptomatic improvement.  2:14 PM Patient reassess -- reports significant improvement of GERD and nausea. Able to tolerate meal without issue. CBC with mild leukocytosis (improved from previous and stable), HgB 11.0, CMP w normal LFTs, Cr, and electrolytes. PCR is negative. UA is bland. Plan for d/c at this time. Discussed return precautions in detail with patient. Rx sent for Omeprazole, Pepcid, Zofran.   Assessment and Plan  Gastroesophageal reflux disease, unspecified whether esophagitis present  Nausea and vomiting in pregnancy: Symptoms improved with h2 blocker, antiemetic  Rx sent for Omeprazole, Pepcid, Zofran  return precautions discussed  Patient discussed with Dr. Alysia Penna.  Sundra Aland, MD OB Fellow, Faculty [facility]  12/15/2022, 12:17 PM

## 2022-12-16 LAB — ABO/RH: ABO/RH(D): A POS

## 2022-12-19 ENCOUNTER — Encounter: Payer: BC Managed Care – PPO | Admitting: Obstetrics

## 2022-12-20 ENCOUNTER — Encounter: Payer: Self-pay | Admitting: Licensed Practical Nurse

## 2022-12-20 ENCOUNTER — Other Ambulatory Visit (HOSPITAL_COMMUNITY)
Admission: RE | Admit: 2022-12-20 | Discharge: 2022-12-20 | Disposition: A | Discharge status: Home / Self Care | Payer: BC Managed Care – PPO | Source: Ambulatory Visit | Attending: Obstetrics | Admitting: Obstetrics

## 2022-12-20 ENCOUNTER — Other Ambulatory Visit: Payer: BC Managed Care – PPO

## 2022-12-20 ENCOUNTER — Ambulatory Visit (INDEPENDENT_AMBULATORY_CARE_PROVIDER_SITE_OTHER): Payer: BC Managed Care – PPO | Admitting: Licensed Practical Nurse

## 2022-12-20 VITALS — BP 106/74 | HR 97 | Wt 258.8 lb

## 2022-12-20 DIAGNOSIS — Z3A36 36 weeks gestation of pregnancy: Secondary | ICD-10-CM | POA: Diagnosis not present

## 2022-12-20 DIAGNOSIS — Z3403 Encounter for supervision of normal first pregnancy, third trimester: Secondary | ICD-10-CM

## 2022-12-20 DIAGNOSIS — Z23 Encounter for immunization: Secondary | ICD-10-CM | POA: Diagnosis not present

## 2022-12-20 DIAGNOSIS — Z2911 Encounter for prophylactic immunotherapy for respiratory syncytial virus (RSV): Secondary | ICD-10-CM

## 2022-12-20 DIAGNOSIS — F32A Depression, unspecified: Secondary | ICD-10-CM

## 2022-12-20 DIAGNOSIS — Z6839 Body mass index (BMI) 39.0-39.9, adult: Secondary | ICD-10-CM

## 2022-12-20 LAB — POCT URINALYSIS DIPSTICK
Bilirubin, UA: NEGATIVE
Blood, UA: NEGATIVE
Glucose, UA: NEGATIVE
Ketones, UA: NEGATIVE
Leukocytes, UA: NEGATIVE
Nitrite, UA: NEGATIVE
Protein, UA: NEGATIVE
Spec Grav, UA: 1.015 (ref 1.010–1.025)
Urobilinogen, UA: 0.2 E.U./dL
pH, UA: 6.5 (ref 5.0–8.0)

## 2022-12-20 MED ORDER — SERTRALINE HCL 50 MG PO TABS
50.0000 mg | ORAL_TABLET | Freq: Every day | ORAL | 3 refills | Status: DC
Start: 2022-12-20 — End: 2023-05-06

## 2022-12-20 NOTE — Progress Notes (Addendum)
Routine Prenatal Care Visit  Subjective  Maureen Gutierrez is a 22 y.o. G1P0000 at [redacted]w[redacted]d being seen today for ongoing prenatal care.  She is currently monitored for the following issues for this high-risk pregnancy and has Left ovarian cyst; Keratosis pilaris; Hidradenitis suppurativa; Tobacco dependence; Panic disorder; Chronic neutrophilia; Encounter for supervision of normal first pregnancy in third trimester; and BMI 40.0-44.9, adult (HCC) on their problem list.  ----------------------------------------------------------------------------------- Patient reports  hip pain, sleep disturbance, heartburn .   Was seen recently at the MAU for heartburn , was prescribed medications, she has not yet picked up d/t expense, will get OTC.  -taking CBE -her boyfriend and his mother and SIL will be her labor support. Her family will be away on a cruise but will be available PP. -has struggled with Depression was considering starting medication just before she became pregnant, mood has been fine throughout pregnancy. Would like to start medication today to decrease chance of PPD.  -BMI hovering around 40, RNST today, possible IOL at 39-40wks   Contractions: Not present. Vag. Bleeding: None.  Movement: Present. Leaking Fluid denies.  ----------------------------------------------------------------------------------- The following portions of the patient's history were reviewed and updated as appropriate: allergies, current medications, past family history, past medical history, past social history, past surgical history and problem list. Problem list updated.  Objective  Blood pressure 106/74, pulse 97, weight 258 lb 12.8 oz (117.4 kg), last menstrual period 04/12/2022. Pregravid weight 244 lb (110.7 kg) Total Weight Gain 14 lb 12.8 oz (6.713 kg) Urinalysis: Urine Protein    Urine Glucose    Fetal Status: Fetal Heart Rate (bpm): 140 Fundal Height: 39 cm Movement: Present     Baseline 140, moderate  variability , pos acel neg decel   General:  Alert, oriented and cooperative. Patient is in no acute distress.  Skin: Skin is warm and dry. No rash noted.   Cardiovascular: Normal heart rate noted  Respiratory: Normal respiratory effort, no problems with respiration noted  Abdomen: Soft, gravid, appropriate for gestational age. Pain/Pressure: Absent     Pelvic:  Cervical exam deferred        Extremities: Normal range of motion.  Edema: Mild pitting, slight indentation  Mental Status: Normal mood and affect. Normal behavior. Normal judgment and thought content.   Assessment   22 y.o. G1P0000 at [redacted]w[redacted]d by  01/17/2023, by Last Menstrual Period presenting for routine prenatal visit  Plan   GI Problems (from 06/07/22 to present)     No problems associated with this episode.        Preterm labor symptoms and general obstetric precautions including but not limited to vaginal bleeding, contractions, leaking of fluid and fetal movement were reviewed in detail with the patient. Please refer to After Visit Summary for other counseling recommendations.   Return in about 1 week (around 12/27/2022) for ROB, NST.  36 wks labs collected  Will start Zoloft 25mg  daily x 1 week then increase to 50mg  daily RSV vaccine given  Growth [place] ordered for high BMI and S>D    Carie Caddy, CNM   [facility]  12/27/22  12:50 PM

## 2022-12-22 LAB — STREP GP B NAA: Strep Gp B NAA: POSITIVE — AB

## 2022-12-23 ENCOUNTER — Encounter: Payer: Self-pay | Admitting: Licensed Practical Nurse

## 2022-12-23 LAB — CERVICOVAGINAL ANCILLARY ONLY
Chlamydia: POSITIVE — AB
Comment: NEGATIVE
Comment: NORMAL
Neisseria Gonorrhea: NEGATIVE

## 2022-12-23 NOTE — Addendum Note (Signed)
Addended by: Kathlene Cote on: 12/23/2022 10:11 AM   Modules accepted: Orders

## 2022-12-24 ENCOUNTER — Other Ambulatory Visit: Payer: Self-pay

## 2022-12-24 DIAGNOSIS — A749 Chlamydial infection, unspecified: Secondary | ICD-10-CM

## 2022-12-24 MED ORDER — AZITHROMYCIN 500 MG PO TABS: 1000.0000 mg | ORAL_TABLET | Freq: Once | ORAL | 0 refills | Status: AC

## 2022-12-24 MED ORDER — AZITHROMYCIN 500 MG PO TABS
1000.0000 mg | ORAL_TABLET | Freq: Once | ORAL | 0 refills | Status: DC
Start: 2022-12-24 — End: 2022-12-24

## 2022-12-27 ENCOUNTER — Ambulatory Visit (INDEPENDENT_AMBULATORY_CARE_PROVIDER_SITE_OTHER): Payer: BC Managed Care – PPO

## 2022-12-27 ENCOUNTER — Ambulatory Visit (INDEPENDENT_AMBULATORY_CARE_PROVIDER_SITE_OTHER): Payer: BC Managed Care – PPO | Admitting: Certified Nurse Midwife

## 2022-12-27 VITALS — BP 109/66 | HR 86 | Ht 68.0 in | Wt 258.0 lb

## 2022-12-27 VITALS — BP 109/66 | HR 86 | Wt 258.0 lb

## 2022-12-27 DIAGNOSIS — Z3403 Encounter for supervision of normal first pregnancy, third trimester: Secondary | ICD-10-CM

## 2022-12-27 DIAGNOSIS — O99213 Obesity complicating pregnancy, third trimester: Secondary | ICD-10-CM

## 2022-12-27 DIAGNOSIS — Z3A37 37 weeks gestation of pregnancy: Secondary | ICD-10-CM | POA: Insufficient documentation

## 2022-12-27 DIAGNOSIS — Z6839 Body mass index (BMI) 39.0-39.9, adult: Secondary | ICD-10-CM | POA: Insufficient documentation

## 2022-12-27 DIAGNOSIS — E669 Obesity, unspecified: Secondary | ICD-10-CM | POA: Diagnosis not present

## 2022-12-27 DIAGNOSIS — Z23 Encounter for immunization: Secondary | ICD-10-CM

## 2022-12-27 DIAGNOSIS — O99820 Streptococcus B carrier state complicating pregnancy: Secondary | ICD-10-CM

## 2022-12-27 LAB — POCT URINALYSIS DIPSTICK OB
Bilirubin, UA: NEGATIVE
Blood, UA: NEGATIVE
Glucose, UA: NEGATIVE
Ketones, UA: NEGATIVE
Nitrite, UA: NEGATIVE
Spec Grav, UA: 1.015 (ref 1.010–1.025)
Urobilinogen, UA: 1 U/dL
pH, UA: 7 (ref 5.0–8.0)

## 2022-12-27 NOTE — Patient Instructions (Signed)

## 2022-12-27 NOTE — Telephone Encounter (Signed)
Reviewed chart. Additional rx was sent in for partner on 12/24/22 by clinical staff.

## 2022-12-27 NOTE — Progress Notes (Signed)
    NURSE VISIT NOTE  Subjective:    Patient ID: Maureen Gutierrez, female    DOB: [birth year], 22 y.o.   MRN: [id number]  HPI  Patient is a 22 y.o. G78P0000 female who presents for fetal monitoring per order from Carie Caddy, [place].   Objective:    BP 109/66   Pulse 86   Ht 5\' 8"  (1.727 m)   Wt 258 lb (117 kg)   LMP 04/12/2022 (Approximate)   BMI 39.23 kg/m  Estimated Date of Delivery: 01/17/23  [redacted]w[redacted]d  Fetus A Non-Stress Test Interpretation for 12/27/22  Indication: Obesity  Fetal Heart Rate A Mode: External Baseline Rate (A): 145 bpm Variability: Moderate Accelerations: 15 x 15 Decelerations: None Multiple birth?: No  Uterine Activity Mode: Toco Contraction Frequency (min): None  Interpretation (Fetal Testing) Nonstress Test Interpretation: Reactive Overall Impression: Reassuring for gestational age   Assessment:   1. Body mass index (BMI) of 39.0-39.9 in adult   2. [redacted] weeks gestation of pregnancy      Plan:   Results reviewed and discussed with patient by  Hartley Barefoot, CNM.     Rocco Serene, LPN

## 2022-12-28 NOTE — Patient Instructions (Signed)
Third Trimester of Pregnancy  The third trimester of pregnancy is from week 28 through week 40. This is months 7 through 9. The third trimester is a time when the unborn baby (fetus) is growing rapidly. At the end of the ninth month, the fetus is about 20 inches long and weighs 6-10 pounds. Body changes during your third trimester During the third trimester, your body will continue to go through many changes. The changes vary and generally return to normal after your baby is born. Physical changes Your weight will continue to increase. You can expect to gain 25-35 pounds (11-16 kg) by the end of the pregnancy if you begin pregnancy at a normal weight. If you are underweight, you can expect to gain 28-40 lb (about 13-18 kg), and if you are overweight, you can expect to gain 15-25 lb (about 7-11 kg). You may begin to get stretch marks on your hips, abdomen, and breasts. Your breasts will continue to grow and may hurt. A yellow fluid (colostrum) may leak from your breasts. This is the first milk you are producing for your baby. You may have changes in your hair. These can include thickening of your hair, rapid growth, and changes in texture. Some people also have hair loss during or after pregnancy, or hair that feels dry or thin. Your belly button may stick out. You may notice more swelling in your hands, face, or ankles. Health changes You may have heartburn. You may have constipation. You may develop hemorrhoids. You may develop swollen, bulging veins (varicose veins) in your legs. You may have increased body aches in the pelvis, back, or thighs. This is due to weight gain and increased hormones that are relaxing your joints. You may have increased tingling or numbness in your hands, arms, and legs. The skin on your abdomen may also feel numb. You may feel short of breath because of your expanding uterus. Other changes You may urinate more often because the fetus is moving lower into your pelvis  and pressing on your bladder. You may have more problems sleeping. This may be caused by the size of your abdomen, an increased need to urinate, and an increase in your body's metabolism. You may notice the fetus "dropping," or moving lower in your abdomen (lightening). You may have increased vaginal discharge. You may notice that you have pain around your pelvic bone as your uterus distends. Follow these instructions at home: Medicines Follow your health care provider's instructions regarding medicine use. Specific medicines may be either safe or unsafe to take during pregnancy. Do not take any medicines unless approved by your health care provider. Take a prenatal vitamin that contains at least 600 micrograms (mcg) of folic acid. Eating and drinking Eat a healthy diet that includes fresh fruits and vegetables, whole grains, good sources of protein such as meat, eggs, or tofu, and low-fat dairy products. Avoid raw meat and unpasteurized juice, milk, and cheese. These carry germs that can harm you and your baby. Eat 4 or 5 small meals rather than 3 large meals a day. You may need to take these actions to prevent or treat constipation: Drink enough fluid to keep your urine pale yellow. Eat foods that are high in fiber, such as beans, whole grains, and fresh fruits and vegetables. Limit foods that are high in fat and processed sugars, such as fried or sweet foods. Activity Exercise only as directed by your health care provider. Most people can continue their usual exercise routine during pregnancy. Try   to exercise for 30 minutes at least 5 days a week. Stop exercising if you experience contractions in the uterus. Stop exercising if you develop pain or cramping in the lower abdomen or lower back. Avoid heavy lifting. Do not exercise if it is very hot or humid or if you are at a high altitude. If you choose to, you may continue to have sex unless your health care provider tells you not  to. Relieving pain and discomfort Take frequent breaks and rest with your legs raised (elevated) if you have leg cramps or low back pain. Take warm sitz baths to soothe any pain or discomfort caused by hemorrhoids. Use hemorrhoid cream if your health care provider approves. Wear a supportive bra to prevent discomfort from breast tenderness. If you develop varicose veins: Wear support hose as told by your health care provider. Elevate your feet for 15 minutes, 3-4 times a day. Limit salt in your diet. Safety Talk to your health care provider before traveling far distances. Do not use hot tubs, steam rooms, or saunas. Wear your seat belt at all times when driving or riding in a car. Talk with your health care provider if someone is verbally or physically abusive to you. Preparing for birth To prepare for the arrival of your baby: Take prenatal classes to understand, practice, and ask questions about labor and delivery. Visit the hospital and tour the maternity area. Purchase a rear-facing car seat and make sure you know how to install it in your car. Prepare the baby's room or sleeping area. Make sure to remove all pillows and stuffed animals from the baby's crib to prevent suffocation. General instructions Avoid cat litter boxes and soil used by cats. These carry germs that can cause birth defects in the baby. If you have a cat, ask someone to clean the litter box for you. Do not douche or use tampons. Do not use scented sanitary pads. Do not use any products that contain nicotine or tobacco, such as cigarettes, e-cigarettes, and chewing tobacco. If you need help quitting, ask your health care provider. Do not use any herbal remedies, illegal drugs, or medicines that were not prescribed to you. Chemicals in these products can harm your baby. Do not drink alcohol. You will have more frequent prenatal exams during the third trimester. During a routine prenatal visit, your health care provider  will do a physical exam, perform tests, and discuss your overall health. Keep all follow-up visits. This is important. Where to find more information American Pregnancy Association: americanpregnancy.org American College of Obstetricians and Gynecologists: acog.org/en/Womens%20Health/Pregnancy Office on Women's Health: womenshealth.gov/pregnancy Contact a health care provider if you have: A fever. Mild pelvic cramps, pelvic pressure, or nagging pain in your abdominal area or lower back. Vomiting or diarrhea. Bad-smelling vaginal discharge or foul-smelling urine. Pain when you urinate. A headache that does not go away when you take medicine. Visual changes or see spots in front of your eyes. Get help right away if: Your water breaks. You have regular contractions less than 5 minutes apart. You have spotting or bleeding from your vagina. You have severe abdominal pain. You have difficulty breathing. You have chest pain. You have fainting spells. You have not felt your baby move for the time period told by your health care provider. You have new or increased pain, swelling, or redness in an arm or leg. Summary The third trimester of pregnancy is from week 28 through week 40 (months 7 through 9). You may have more problems   sleeping. This can be caused by the size of your abdomen, an increased need to urinate, and an increase in your body's metabolism. You will have more frequent prenatal exams during the third trimester. Keep all follow-up visits. This is important. This information is not intended to replace advice given to you by your health care provider. Make sure you discuss any questions you have with your health care provider. Document Revised: 08/25/2019 Document Reviewed: 07/01/2019 Elsevier Patient Education  2024 Elsevier Inc.  Group B Streptococcus Infection During Pregnancy Group B Streptococcus (GBS) is a type of bacteria that is often found in healthy people. It is  commonly found in the rectum, vagina, and intestines. In people who are healthy and not pregnant, the bacteria rarely cause serious illness or complications. However, women who test positive for GBS during pregnancy can pass the bacteria to the baby during childbirth. This can cause serious infection in the baby after birth. Women with GBS may also have infections during their pregnancy or soon after childbirth. The infections include urinary tract infections (UTIs) or infections of the uterus. GBS also increases a woman's risk of complications during pregnancy, such as early labor or delivery, miscarriage, or stillbirth. Routine testing for GBS is recommended for all pregnant women. What are the causes? This condition is caused by bacteria called Streptococcus agalactiae. What increases the risk? You may have a higher risk for GBS infection during pregnancy if you had one during a past pregnancy. What are the signs or symptoms? In most cases, GBS infection does not cause symptoms in pregnant women. If symptoms exist, they may include: Labor that starts before the 37th week of pregnancy. A UTI or bladder infection. This may cause a fever, frequent urination, or pain and burning during urination. Fever during labor. There can also be a rapid heartbeat in the mother or baby. Rare but serious symptoms of a GBS infection in women include: Blood infection (septicemia). This may cause fever, chills, or confusion. Lung infection (pneumonia). This may cause fever, chills, cough, rapid breathing, chest pain, or difficulty breathing. Bone, joint, skin, or soft tissue infection. How is this diagnosed? You may be screened for GBS between week 35 and week 37 of pregnancy. If you have symptoms of preterm labor, you may be screened earlier. This condition is diagnosed based on lab test results from: A swab of fluid from the vagina and rectum. A urine sample. How is this treated? This condition is treated with  antibiotic medicine. Antibiotic medicine may be given: To you when you go into labor, or as soon as your water breaks. The medicines will continue until after you give birth. If you are having a cesarean delivery, you do not need antibiotics unless your water has broken. To your baby, if he or she requires treatment. Your health care provider will check your baby to decide if he or she needs antibiotics to prevent a serious infection. Follow these instructions at home: Take over-the-counter and prescription medicines only as told by your health care provider. Take your antibiotic medicine as told by your health care provider. Do not stop taking the antibiotic even if you start to feel better. Keep all pre-birth (prenatal) visits and follow-up visits as told by your health care provider. This is important. Contact a health care provider if: You have pain or burning when you urinate. You have to urinate more often than usual. You have a fever or chills. You develop a bad-smelling vaginal discharge. Get help right away if:   Your water breaks. You go into labor. You have severe pain in your abdomen. You have difficulty breathing. You have chest pain. These symptoms may represent a serious problem that is an emergency. Do not wait to see if the symptoms will go away. Get medical help right away. Call your local emergency services (911 in the [place] Do not drive yourself to the hospital. Summary GBS is a type of bacteria that is common in healthy people. During pregnancy, colonization with GBS can cause serious complications for you or your baby. Your health care provider will screen you between 35 and 37 weeks of pregnancy to determine if you are colonized with GBS. If you are colonized with GBS during pregnancy, your health care provider will recommend antibiotics through an IV during labor. After delivery, your baby will be evaluated for complications related to potential GBS infection and may  require antibiotics to prevent a serious infection. This information is not intended to replace advice given to you by your health care provider. Make sure you discuss any questions you have with your health care provider. Document Revised: 03/04/2022 Document Reviewed: 03/04/2022 Elsevier Patient Education  2024 Elsevier Inc.  

## 2022-12-28 NOTE — Progress Notes (Signed)
    Return Prenatal Note   Subjective   22 y.o. G1P0000 at [redacted]w[redacted]d presents for this follow-up prenatal visit.  Patient feeling well, active baby, no complaint of. Here with FOB & her grandmother. NST completed and reactive. Has follow up ultrasound for growth scheduled. Flu shot today. Patient reports: Movement: Present Contractions: Not present  Objective   Flow sheet Vitals: Pulse Rate: 86 BP: 109/66 Fundal Height: 38 cm Fetal Heart Rate (bpm): 145 Presentation: Vertex Total weight gain: 14 lb (6.35 kg)  General Appearance  No acute distress, well appearing, and well nourished Pulmonary   Normal work of breathing Neurologic   Alert and oriented to person, place, and time Psychiatric   Mood and affect within normal limits  Assessment/Plan   Plan  22 y.o. G1P0000 at [redacted]w[redacted]d presents for follow-up OB visit. Reviewed prenatal record including previous visit note. 1. Encounter for supervision of normal first pregnancy in third trimester - POC Urinalysis Dipstick OB  2. Encounter for immunization - Flu vaccine trivalent PF, 6mos and older(Flulaval,Afluria,Fluarix,Fluzone)  3. GBS (group B Streptococcus carrier), +RV culture, currently pregnant    Orders Placed This Encounter  Procedures   Flu vaccine trivalent PF, 6mos and older(Flulaval,Afluria,Fluarix,Fluzone)   POC Urinalysis Dipstick OB   Return in 1 week (on 01/03/2023) for ROB & NST.   Future Appointments  Date Time Provider Department Center  01/03/2023 10:15 AM AOB-AOB [place] 1 AOB-IMG None  01/03/2023  2:35 PM Doreene Burke, CNM AOB-A[facility] None  01/10/2023  1:15 PM AOB-NST ROOM AOB-AOB None  01/10/2023  2:35 PM Free, Lindalou Hose, CNM AOB-A[facility] None  01/17/2023  1:15 PM AOB-NST ROOM AOB-AOB None  01/17/2023  2:35 PM Dominic, Courtney Heys, CNM AOB-AOB None    For next visit:  continue with routine prenatal care     Dominica Severin, CNM

## 2022-12-31 ENCOUNTER — Encounter: Payer: Self-pay | Admitting: Certified Nurse Midwife

## 2022-12-31 DIAGNOSIS — Z419 Encounter for procedure for purposes other than remedying health state, unspecified: Secondary | ICD-10-CM | POA: Diagnosis not present

## 2023-01-03 ENCOUNTER — Encounter: Payer: Self-pay | Admitting: Certified Nurse Midwife

## 2023-01-03 ENCOUNTER — Ambulatory Visit: Payer: BC Managed Care – PPO

## 2023-01-03 ENCOUNTER — Ambulatory Visit (INDEPENDENT_AMBULATORY_CARE_PROVIDER_SITE_OTHER): Payer: BC Managed Care – PPO | Admitting: Certified Nurse Midwife

## 2023-01-03 ENCOUNTER — Other Ambulatory Visit: Payer: BC Managed Care – PPO

## 2023-01-03 VITALS — BP 120/82 | HR 96 | Wt 256.8 lb

## 2023-01-03 DIAGNOSIS — Z3A38 38 weeks gestation of pregnancy: Secondary | ICD-10-CM

## 2023-01-03 DIAGNOSIS — Z6839 Body mass index (BMI) 39.0-39.9, adult: Secondary | ICD-10-CM

## 2023-01-03 DIAGNOSIS — Z362 Encounter for other antenatal screening follow-up: Secondary | ICD-10-CM

## 2023-01-03 DIAGNOSIS — Z3403 Encounter for supervision of normal first pregnancy, third trimester: Secondary | ICD-10-CM | POA: Diagnosis not present

## 2023-01-03 NOTE — Progress Notes (Signed)
ROB and u/s for growth/BPP due to elevated BMI in pregnancy. Results reviewed with pt. See below. Discussed growth and encouraged pt to eat healthy diet and exercise . Reviwed induction at 40 wks . Will schedule at next appointment. She verbalizes understanding and agrees. Herbal prep handout given. Follow up 1 wk for NST and ROB.   Doreene Burke, CNM     Gershon Cull E, RT on 01/03/2023 10:40 AM  ULTRASOUND REPORT   Location: [facility] Date of Service: 01/03/2023    Indications:growth/afi - high BMI Findings:  Mason Jim intrauterine pregnancy is visualized with FHR at 141 bpm.    Biometrics gives an (U/S) Gestational age of [redacted]w[redacted]d and an (U/S) EDD of 01/11/2023; this correlates with the clinically established Estimated Date of Delivery: 01/17/23.    Fetal presentation is Cephalic.  Placenta: fundal.  AFI: 13.15 cm   Growth percentile is 72.7.   AC percentile is 82.9. EFW: 3529g    Impression: 1. [redacted]w[redacted]d Viable Single Intrauterine pregnancy dated by previously established criteria. 2. Growth is 72.7 %ile.  AFI is 13.15 cm.    Recommendations: 1.Clinical correlation with the patient's History and Physical Exam.     Waldo Laine, RT   Gershon Cull E, RT on 01/03/2023 10:41 AM  ULTRASOUND REPORT   Location: [facility] Date of Service: 01/03/2023    Indications: Biophysical Profile performed for indication of high BMI   Findings:  Singleton intrauterine pregnancy is visualized with FHR at 141 bpm.    Fetal presentation is Cephalic.  Placenta: fundal AFI: 13.15 cm   BPP Scoring: Movement: 2/2  Tone: 2/2  Breathing: 2/2  AFI: 2/2   Impression: 1. [redacted]w[redacted]d Viable Singleton Intrauterine pregnancy dated by previously established criteria. 2. AFI is 13.15 cm.  3. BPP is 8/8   Recommendations: 1.Clinical correlation with the patient's History and Physical Exam.     Waldo Laine, RT

## 2023-01-09 DIAGNOSIS — Z3A38 38 weeks gestation of pregnancy: Secondary | ICD-10-CM | POA: Insufficient documentation

## 2023-01-09 DIAGNOSIS — O99213 Obesity complicating pregnancy, third trimester: Secondary | ICD-10-CM | POA: Insufficient documentation

## 2023-01-09 NOTE — Patient Instructions (Signed)

## 2023-01-09 NOTE — Progress Notes (Signed)
    NURSE VISIT NOTE  Subjective:    Patient ID: Maureen Gutierrez, female    DOB: [birth year], 22 y.o.   MRN: [id number]  HPI  Patient is a 22 y.o. G25P1001 female who presents for fetal monitoring per order from Doreene Burke, CNM.   Objective:    BP 100/67   Pulse 84   Ht 5\' 9"  (1.753 m)   Wt 262 lb 9.6 oz (119.1 kg)   LMP 04/12/2022 (Approximate)   BMI 38.84 kg/m  Estimated Date of Delivery: 01/17/23  [redacted]w[redacted]d  Fetus A Non-Stress Test Interpretation for 01/22/23  Indication: Obesity  Fetal Heart Rate A Mode: External Baseline Rate (A): 135 bpm Variability: Moderate Accelerations: 15 x 15 Decelerations: None Multiple birth?: No  Uterine Activity Mode: Toco Contraction Frequency (min): None  Interpretation (Fetal Testing) Nonstress Test Interpretation: Reactive Overall Impression: Reassuring for gestational age   Assessment:   1. Obesity affecting pregnancy in third trimester, unspecified obesity type   2. [redacted] weeks gestation of pregnancy      Plan:   Results reviewed and discussed with patient by  Autumn Messing, CNM.     Rocco Serene, LPN

## 2023-01-10 ENCOUNTER — Ambulatory Visit (INDEPENDENT_AMBULATORY_CARE_PROVIDER_SITE_OTHER): Payer: BC Managed Care – PPO

## 2023-01-10 ENCOUNTER — Ambulatory Visit: Payer: BC Managed Care – PPO

## 2023-01-10 VITALS — BP 100/67 | HR 84 | Wt 262.6 lb

## 2023-01-10 VITALS — BP 100/67 | HR 84 | Ht 69.0 in | Wt 262.6 lb

## 2023-01-10 DIAGNOSIS — Z3A40 40 weeks gestation of pregnancy: Secondary | ICD-10-CM | POA: Diagnosis not present

## 2023-01-10 DIAGNOSIS — O480 Post-term pregnancy: Secondary | ICD-10-CM | POA: Diagnosis not present

## 2023-01-10 DIAGNOSIS — E669 Obesity, unspecified: Secondary | ICD-10-CM

## 2023-01-10 DIAGNOSIS — Z3A38 38 weeks gestation of pregnancy: Secondary | ICD-10-CM

## 2023-01-10 DIAGNOSIS — Z3403 Encounter for supervision of normal first pregnancy, third trimester: Secondary | ICD-10-CM

## 2023-01-10 DIAGNOSIS — O99213 Obesity complicating pregnancy, third trimester: Secondary | ICD-10-CM | POA: Diagnosis not present

## 2023-01-10 DIAGNOSIS — Z6839 Body mass index (BMI) 39.0-39.9, adult: Secondary | ICD-10-CM

## 2023-01-10 DIAGNOSIS — Z3A39 39 weeks gestation of pregnancy: Secondary | ICD-10-CM

## 2023-01-10 LAB — POCT URINALYSIS DIPSTICK OB
Bilirubin, UA: NEGATIVE
Blood, UA: NEGATIVE
Glucose, UA: NEGATIVE
Ketones, UA: NEGATIVE
Leukocytes, UA: NEGATIVE
Nitrite, UA: NEGATIVE
Spec Grav, UA: 1.015 (ref 1.010–1.025)
Urobilinogen, UA: 0.2 U/dL
pH, UA: 6 (ref 5.0–8.0)

## 2023-01-10 NOTE — Assessment & Plan Note (Addendum)
-   SVE today per patient's request, 1-2/50/-3). Would like membrane sweep at next visit. Has started using evening primrose oil.  - Reviewed labor warning signs and expectations for birth. Instructed to call office or come to hospital with persistent headache, vision changes, regular contractions, leaking of fluid, decreased fetal movement or vaginal bleeding.

## 2023-01-10 NOTE — Assessment & Plan Note (Signed)
-   IOL scheduled today for 10/20 at 0500.  - Reactive NST in clinic.

## 2023-01-10 NOTE — Progress Notes (Signed)
    Return Prenatal Note   Assessment/Plan   Plan  22 y.o. G1P0000 at [redacted]w[redacted]d presents for follow-up OB visit. Reviewed prenatal record including previous visit note.  Body mass index (BMI) of 39.0-39.9 in adult - IOL scheduled today for 10/20 at 0500.  - Reactive NST in clinic.   Encounter for supervision of normal first pregnancy in third trimester - SVE today per patient's request, 1-2/50/-3). Would like membrane sweep at next visit. Has started using evening primrose oil.  - Reviewed labor warning signs and expectations for birth. Instructed to call office or come to hospital with persistent headache, vision changes, regular contractions, leaking of fluid, decreased fetal movement or vaginal bleeding.    Orders Placed This Encounter  Procedures   POC Urinalysis Dipstick OB   Return in about 1 week (around 01/17/2023) for ROB with NST.   Future Appointments  Date Time Provider Department Center  01/17/2023  1:15 PM AOB-NST ROOM AOB-AOB None  01/17/2023  2:35 PM Dominic, Courtney Heys, CNM AOB-AOB None    For next visit:  continue with routine prenatal care     Subjective   22 y.o. G1P0000 at [redacted]w[redacted]d presents for this follow-up prenatal visit.  Patient ready for baby. Patient reports: Movement: Present Contractions: Irritability  Objective   Flow sheet Vitals: Pulse Rate: 84 BP: 100/67 Fundal Height: 40 cm Fetal Heart Rate (bpm): RNSt Presentation: Vertex Dilation: 1.5 Effacement (%): 50 Station: -3 Total weight gain: 18 lb 9.6 oz (8.437 kg)  General Appearance  No acute distress, well appearing, and well nourished Pulmonary   Normal work of breathing Neurologic   Alert and oriented to person, place, and time Psychiatric   Mood and affect within normal limits  Lindalou Hose Antoinett Dorman, CNM  10/11/244:14 PM

## 2023-01-17 ENCOUNTER — Other Ambulatory Visit: Payer: BC Managed Care – PPO

## 2023-01-17 ENCOUNTER — Other Ambulatory Visit (HOSPITAL_COMMUNITY)
Admission: RE | Admit: 2023-01-17 | Discharge: 2023-01-17 | Disposition: A | Discharge status: Home / Self Care | Payer: BC Managed Care – PPO | Source: Ambulatory Visit | Attending: Licensed Practical Nurse | Admitting: Licensed Practical Nurse

## 2023-01-17 ENCOUNTER — Other Ambulatory Visit: Payer: Self-pay

## 2023-01-17 ENCOUNTER — Encounter: Payer: Self-pay | Admitting: Obstetrics and Gynecology

## 2023-01-17 ENCOUNTER — Inpatient Hospital Stay
Admission: EM | Admit: 2023-01-17 | Discharge: 2023-01-20 | DRG: 787 | Disposition: A | Discharge status: Home / Self Care | Payer: BC Managed Care – PPO | Admitting: Obstetrics and Gynecology

## 2023-01-17 ENCOUNTER — Ambulatory Visit (INDEPENDENT_AMBULATORY_CARE_PROVIDER_SITE_OTHER): Payer: BC Managed Care – PPO | Admitting: Licensed Practical Nurse

## 2023-01-17 ENCOUNTER — Inpatient Hospital Stay (HOSPITAL_COMMUNITY): Admission: AD | Admit: 2023-01-17 | Payer: BC Managed Care – PPO | Source: Home / Self Care

## 2023-01-17 VITALS — BP 119/78 | HR 79 | Wt 264.9 lb

## 2023-01-17 DIAGNOSIS — O99213 Obesity complicating pregnancy, third trimester: Secondary | ICD-10-CM | POA: Diagnosis present

## 2023-01-17 DIAGNOSIS — O9081 Anemia of the puerperium: Secondary | ICD-10-CM | POA: Diagnosis not present

## 2023-01-17 DIAGNOSIS — Z8249 Family history of ischemic heart disease and other diseases of the circulatory system: Secondary | ICD-10-CM | POA: Diagnosis not present

## 2023-01-17 DIAGNOSIS — O99214 Obesity complicating childbirth: Secondary | ICD-10-CM | POA: Diagnosis present

## 2023-01-17 DIAGNOSIS — Z3403 Encounter for supervision of normal first pregnancy, third trimester: Secondary | ICD-10-CM | POA: Diagnosis not present

## 2023-01-17 DIAGNOSIS — O9903 Anemia complicating the puerperium: Secondary | ICD-10-CM | POA: Diagnosis not present

## 2023-01-17 DIAGNOSIS — O480 Post-term pregnancy: Secondary | ICD-10-CM

## 2023-01-17 DIAGNOSIS — O26843 Uterine size-date discrepancy, third trimester: Secondary | ICD-10-CM | POA: Diagnosis not present

## 2023-01-17 DIAGNOSIS — Z7982 Long term (current) use of aspirin: Secondary | ICD-10-CM | POA: Diagnosis not present

## 2023-01-17 DIAGNOSIS — Z833 Family history of diabetes mellitus: Secondary | ICD-10-CM | POA: Diagnosis not present

## 2023-01-17 DIAGNOSIS — Z113 Encounter for screening for infections with a predominantly sexual mode of transmission: Secondary | ICD-10-CM | POA: Diagnosis not present

## 2023-01-17 DIAGNOSIS — E669 Obesity, unspecified: Secondary | ICD-10-CM | POA: Diagnosis not present

## 2023-01-17 DIAGNOSIS — O4292 Full-term premature rupture of membranes, unspecified as to length of time between rupture and onset of labor: Secondary | ICD-10-CM | POA: Diagnosis not present

## 2023-01-17 DIAGNOSIS — A749 Chlamydial infection, unspecified: Secondary | ICD-10-CM | POA: Diagnosis not present

## 2023-01-17 DIAGNOSIS — O99824 Streptococcus B carrier state complicating childbirth: Secondary | ICD-10-CM | POA: Diagnosis present

## 2023-01-17 DIAGNOSIS — D62 Acute posthemorrhagic anemia: Secondary | ICD-10-CM | POA: Diagnosis not present

## 2023-01-17 DIAGNOSIS — Z87891 Personal history of nicotine dependence: Secondary | ICD-10-CM

## 2023-01-17 DIAGNOSIS — O98313 Other infections with a predominantly sexual mode of transmission complicating pregnancy, third trimester: Secondary | ICD-10-CM | POA: Diagnosis not present

## 2023-01-17 DIAGNOSIS — O4202 Full-term premature rupture of membranes, onset of labor within 24 hours of rupture: Secondary | ICD-10-CM

## 2023-01-17 DIAGNOSIS — Z3A40 40 weeks gestation of pregnancy: Secondary | ICD-10-CM | POA: Diagnosis not present

## 2023-01-17 DIAGNOSIS — O99820 Streptococcus B carrier state complicating pregnancy: Secondary | ICD-10-CM | POA: Diagnosis not present

## 2023-01-17 LAB — CBC
HCT: 33 % — ABNORMAL LOW (ref 36.0–46.0)
Hemoglobin: 11.1 g/dL — ABNORMAL LOW (ref 12.0–15.0)
MCH: 30.3 pg (ref 26.0–34.0)
MCHC: 33.6 g/dL (ref 30.0–36.0)
MCV: 90.2 fL (ref 80.0–100.0)
Platelets: 301 10*3/uL (ref 150–400)
RBC: 3.66 MIL/uL — ABNORMAL LOW (ref 3.87–5.11)
RDW: 13.3 % (ref 11.5–15.5)
WBC: 15.3 10*3/uL — ABNORMAL HIGH (ref 4.0–10.5)
nRBC: 0 % (ref 0.0–0.2)

## 2023-01-17 LAB — POCT URINALYSIS DIPSTICK OB
Bilirubin, UA: NEGATIVE
Blood, UA: NEGATIVE
Glucose, UA: NEGATIVE
Ketones, UA: NEGATIVE
Leukocytes, UA: NEGATIVE
Nitrite, UA: NEGATIVE
Spec Grav, UA: 1.015 (ref 1.010–1.025)
Urobilinogen, UA: 0.2 U/dL
pH, UA: 6.5 (ref 5.0–8.0)

## 2023-01-17 MED ORDER — OXYTOCIN-SODIUM CHLORIDE 30-0.9 UT/500ML-% IV SOLN
1.0000 m[IU]/min | INTRAVENOUS | Status: DC
  Administered 2023-01-17: 2 m[IU]/min via INTRAVENOUS
  Filled 2023-01-17: qty 500

## 2023-01-17 MED ORDER — SOD CITRATE-CITRIC ACID 500-334 MG/5ML PO SOLN: 30.0000 mL | ORAL | Status: DC | PRN

## 2023-01-17 MED ORDER — OXYTOCIN BOLUS FROM INFUSION: 333.0000 mL | Freq: Once | INTRAVENOUS | Status: DC

## 2023-01-17 MED ORDER — DIPHENHYDRAMINE HCL 50 MG/ML IJ SOLN: 12.5000 mg | INTRAMUSCULAR | Status: DC | PRN

## 2023-01-17 MED ORDER — SERTRALINE HCL 50 MG PO TABS
50.0000 mg | ORAL_TABLET | Freq: Every day | ORAL | Status: DC
  Administered 2023-01-18 (×2): 50 mg via ORAL
  Filled 2023-01-17 (×2): qty 1

## 2023-01-17 MED ORDER — SODIUM CHLORIDE 0.9 % IV SOLN
5.0000 10*6.[IU] | Freq: Once | INTRAVENOUS | Status: AC
  Administered 2023-01-17: 5 10*6.[IU] via INTRAVENOUS
  Filled 2023-01-17: qty 5

## 2023-01-17 MED ORDER — EPHEDRINE 5 MG/ML INJ: 10.0000 mg | INTRAVENOUS | Status: DC | PRN

## 2023-01-17 MED ORDER — ONDANSETRON HCL 4 MG/2ML IJ SOLN
4.0000 mg | Freq: Four times a day (QID) | INTRAMUSCULAR | Status: DC | PRN
  Administered 2023-01-18: 4 mg via INTRAVENOUS

## 2023-01-17 MED ORDER — PHENYLEPHRINE 80 MCG/ML (10ML) SYRINGE FOR IV PUSH (FOR BLOOD PRESSURE SUPPORT): 80.0000 ug | PREFILLED_SYRINGE | INTRAVENOUS | Status: DC | PRN

## 2023-01-17 MED ORDER — LACTATED RINGERS IV SOLN: 500.0000 mL | INTRAVENOUS | Status: DC | PRN

## 2023-01-17 MED ORDER — OXYTOCIN 10 UNIT/ML IJ SOLN: 10.0000 [IU] | Freq: Once | INTRAMUSCULAR | Status: DC

## 2023-01-17 MED ORDER — OXYTOCIN-SODIUM CHLORIDE 30-0.9 UT/500ML-% IV SOLN
2.5000 [IU]/h | INTRAVENOUS | Status: DC
  Administered 2023-01-18: 6 [IU]/h via INTRAVENOUS
  Filled 2023-01-17: qty 500

## 2023-01-17 MED ORDER — PENICILLIN G POT IN DEXTROSE 60000 UNIT/ML IV SOLN
3.0000 10*6.[IU] | INTRAVENOUS | Status: DC
  Administered 2023-01-18 (×3): 3 10*6.[IU] via INTRAVENOUS
  Filled 2023-01-17 (×3): qty 50

## 2023-01-17 MED ORDER — LACTATED RINGERS IV SOLN: INTRAVENOUS | Status: DC

## 2023-01-17 MED ORDER — LIDOCAINE HCL (PF) 1 % IJ SOLN: 30.0000 mL | INTRAMUSCULAR | Status: DC | PRN

## 2023-01-17 MED ORDER — OXYCODONE-ACETAMINOPHEN 5-325 MG PO TABS: 2.0000 | ORAL_TABLET | ORAL | Status: DC | PRN

## 2023-01-17 MED ORDER — TERBUTALINE SULFATE 1 MG/ML IJ SOLN: 0.2500 mg | Freq: Once | INTRAMUSCULAR | Status: DC | PRN

## 2023-01-17 MED ORDER — ACETAMINOPHEN 325 MG PO TABS: 650.0000 mg | ORAL_TABLET | ORAL | Status: DC | PRN

## 2023-01-17 MED ORDER — FENTANYL-BUPIVACAINE-NACL 0.5-0.125-0.9 MG/250ML-% EP SOLN
12.0000 mL/h | EPIDURAL | Status: DC | PRN
  Administered 2023-01-18: 12 mL/h via EPIDURAL
  Filled 2023-01-17: qty 250

## 2023-01-17 MED ORDER — LACTATED RINGERS IV SOLN
500.0000 mL | Freq: Once | INTRAVENOUS | Status: AC
  Administered 2023-01-18: 500 mL via INTRAVENOUS

## 2023-01-17 MED ORDER — OXYCODONE-ACETAMINOPHEN 5-325 MG PO TABS: 1.0000 | ORAL_TABLET | ORAL | Status: DC | PRN

## 2023-01-17 NOTE — Addendum Note (Signed)
Addended by: Fonda Kinder on: 01/17/2023 03:00 PM   Modules accepted: Orders

## 2023-01-17 NOTE — H&P (Signed)
[facility] Labor & Delivery  History and Physical  ASSESSMENT AND PLAN   Maureen Gutierrez is a 22 y.o. G1P0000 at [redacted]w[redacted]d with EDD: 01/17/2023, by Last Menstrual Period admitted for augmentation of labor in the setting of premature rupture of membranes.  Labor Plan - Having some infrequent contractions, will start pitocin for augmentation given positive GBS status.  - GBS prophylaxis with penicillin. - Considering epidural.  - Anticipate SVB.  Pregnancy Complications Elevated BMI - BMI 40, growth [place] on 10/4, EFW 3529g, 72.7%.  Chlamydia in Pregnancy - Treated for chlamydia on 12/20/22, TOC swab collected on admission.  Fetal Status: - cephalic presentation by Leopolds, sutures on SVE - EFW: 8.5 lbs by Leopolds - Continuous fetal monitoring - FHT currently cat I   Labs/Immunizations: Prenatal labs and studies: ABO, Rh: --/--/A POS (09/15 1139) Antibody: Negative (03/25 1439) Rubella: 1.26 (03/25 1439) Varicella: 464 (03/25 1439)  RPR: Non Reactive (07/31 1556)  HBsAg: Negative (03/25 1439)  HepC: Non Reactive (03/25 1439) HIV: Non Reactive (07/31 1556)  GBS: Positive/-- (09/20 1139) , treat with PCN  TDAP: given prenatally, 11/29/22 Flu: 12/27/22 RSV: yes, 12/20/22  Postpartum Plan: - Feeding: No data found - Contraception: plans undecided - Prenatal Care Provider: AOB   HPI   Chief Complaint: Leakage of fluid  Maureen Gutierrez is a 22 y.o. G1P0000 at [redacted]w[redacted]d who presents for leaking fluid and intermittent contractions. Patient states that she felt a large gush of fluid after using the bathroom around 1900. Fluid has continued to leak since that time, soaking peripad, underwear, and pants. Fluid is clear. She denies vaginal bleeding and endorses normal fetal movement. She is feeling intermittent, mild contractions.  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   HISTORY   Medications Medications Prior to Admission  Medication Sig Dispense Refill Last Dose   aspirin EC 81 MG tablet Take 81 mg by mouth daily. Swallow whole.   01/17/2023   ferrous sulfate 325 (65 FE) MG EC tablet Take 325 mg by mouth 3 (three) times daily with meals.   01/17/2023   omeprazole (PRILOSEC OTC) 20 MG tablet Take 1 tablet (20 mg total) by mouth daily. 30 tablet 0 01/17/2023   Prenatal Vit-Fe Fumarate-FA (PRENATAL MULTIVITAMIN) TABS tablet Take 1 tablet by mouth daily at 12 noon.   01/17/2023   famotidine (PEPCID) 20 MG tablet Take 1 tablet (20 mg total) by mouth 3 times/day as needed-between meals & bedtime for heartburn or indigestion. (Patient not taking: Reported on 12/27/2022) 60 tablet 0    ondansetron (ZOFRAN) 4 MG tablet Take 1 tablet (4 mg total) by mouth every 8 (eight) hours as needed for nausea or vomiting. (Patient not taking: Reported on 12/20/2022) 20 tablet 0    sertraline (ZOLOFT) 50 MG tablet Take 1 tablet (50 mg total) by mouth daily. 30 tablet 3     Allergies has No Known Allergies.  OB History OB History  Gravida Para Term Preterm AB Living  1 0 0 0 0 0  SAB IAB Ectopic Multiple Live Births  0 0 0 0 0    # Outcome Date GA Lbr Len/2nd Weight Sex Type Anes PTL Lv  1 Current             Past Medical History Past Medical History:  Diagnosis Date   Anxiety    Eczema    Heavy periods    Menorrhagia with regular cycle 02/16/2018   Ovarian cyst     left - [name]   Painful menstrual periods     Past Surgical History Past Surgical History:  Procedure Laterality Date   TONSILLECTOMY AND ADENOIDECTOMY      Social History  reports that she has quit smoking. Her smoking use included cigarettes. She has quit using smokeless tobacco. She reports that she does not currently use alcohol. She reports that she does not use drugs.   Family History family history includes ADD / ADHD in her brother; Diabetes in her father; Hypertension in her father; Obesity in her mother; Rheum arthritis in her maternal grandmother.   PHYSICAL EXAM   There were no vitals filed for this visit.  Constitutional: No acute distress, well appearing, and well nourished. Neurologic: She is alert and conversational.  Psychiatric: She has a normal mood and affect.  Musculoskeletal: Normal gait, grossly normal range of motion Cardiovascular: Normal rate.   Pulmonary/Chest: Normal work of breathing.  Gastrointestinal/Abdominal: Soft. Gravid. There is no tenderness.  Skin: Skin is warm and dry. No rash noted.  Genitourinary: Normal external female genitalia.  SVE:   Dilation: 4 Effacement (%): 50 Station: -2 Presentation: Vertex Exam by:: Huntley Dec Kittie Krizan CNM SSE: deferred  NST Interpretation Indication: PROM Baseline: 145 bpm Variability: moderate Accelerations: present Decelerations: absent Contractions: irregular Time noted:  See OBIX Impression: reactive, Cat I Authenticated by: Lindalou Hose Edker Punt   Attending Dr. Feliberto Gottron was immediately available for the care of the patient.   Lindalou Hose Siennah Barrasso, CNM

## 2023-01-17 NOTE — Progress Notes (Signed)
Routine Prenatal Care Visit  Subjective  Maureen Gutierrez is a 22 y.o. G1P0000 at [redacted]w[redacted]d being seen today for ongoing prenatal care.  She is currently monitored for the following issues for this high-risk pregnancy and has Left ovarian cyst; Keratosis pilaris; Hidradenitis suppurativa; Tobacco dependence; Panic disorder; Chronic neutrophilia; Encounter for supervision of normal first pregnancy in third trimester; BMI 40.0-44.9, adult (HCC); Body mass index (BMI) of 39.0-39.9 in adult; and Obesity affecting pregnancy in third trimester on their problem list.  ----------------------------------------------------------------------------------- Patient reports  feels like she needs to have BM all of the time, last had BM this afternoon, thinks she has had some contractions .   -Here with her grandmother.  -Has a lot of family around for PP support, her mother, grandmother and partner will be her labor support. -VE 3/50/-1, swept, reviewed IOL that is scheduled on Sunday,  -S> D growth on 10/4 3529 grams, 72.7%, AFI 13.15  -RNST Baseline 135, moderate variability, pos accel, neg decel Toco rare contractions   Contractions: Not present. Vag. Bleeding: None, Bloody Show.  Movement: Present. Leaking Fluid denies.  ----------------------------------------------------------------------------------- The following portions of the patient's history were reviewed and updated as appropriate: allergies, current medications, past family history, past medical history, past social history, past surgical history and problem list. Problem list updated.  Objective  Blood pressure 119/78, pulse 79, weight 264 lb 14.4 oz (120.2 kg), last menstrual period 04/12/2022. Pregravid weight 244 lb (110.7 kg) Total Weight Gain 20 lb 14.4 oz (9.48 kg) Urinalysis: Urine Protein    Urine Glucose    Fetal Status: Fetal Heart Rate (bpm): 135 Fundal Height: 43 cm Movement: Present  Presentation: Vertex  General:  Alert, oriented  and cooperative. Patient is in no acute distress.  Skin: Skin is warm and dry. No rash noted.   Cardiovascular: Normal heart rate noted  Respiratory: Normal respiratory effort, no problems with respiration noted  Abdomen: Soft, gravid, appropriate for gestational age. Pain/Pressure: Present     Pelvic:  Cervical exam performed Dilation: 3 Effacement (%): 50 Station: -1  Extremities: Normal range of motion.  Edema: Trace  Mental Status: Normal mood and affect. Normal behavior. Normal judgment and thought content.   Assessment   22 y.o. G1P0000 at [redacted]w[redacted]d by  01/17/2023, by Last Menstrual Period presenting for routine prenatal visit  Plan   GI Problems (from 06/07/22 to present)     No problems associated with this episode.        Term labor symptoms and general obstetric precautions including but not limited to vaginal bleeding, contractions, leaking of fluid and fetal movement were reviewed in detail with the patient. Please refer to After Visit Summary for other counseling recommendations.   IOL Sunday Gc/ct TOC collected   Jannifer Hick  , [facility]  01/17/23  2:52 PM

## 2023-01-18 ENCOUNTER — Encounter
Admission: EM | Disposition: A | Discharge status: Home / Self Care | Payer: Self-pay | Source: Home / Self Care | Attending: Obstetrics

## 2023-01-18 ENCOUNTER — Inpatient Hospital Stay: Payer: BC Managed Care – PPO | Admitting: Anesthesiology

## 2023-01-18 ENCOUNTER — Encounter: Payer: Self-pay | Admitting: Obstetrics and Gynecology

## 2023-01-18 ENCOUNTER — Inpatient Hospital Stay: Payer: Self-pay | Admitting: Anesthesiology

## 2023-01-18 DIAGNOSIS — D62 Acute posthemorrhagic anemia: Secondary | ICD-10-CM

## 2023-01-18 DIAGNOSIS — O99214 Obesity complicating childbirth: Secondary | ICD-10-CM

## 2023-01-18 DIAGNOSIS — E669 Obesity, unspecified: Secondary | ICD-10-CM

## 2023-01-18 DIAGNOSIS — A749 Chlamydial infection, unspecified: Secondary | ICD-10-CM

## 2023-01-18 DIAGNOSIS — O99820 Streptococcus B carrier state complicating pregnancy: Secondary | ICD-10-CM

## 2023-01-18 DIAGNOSIS — O98313 Other infections with a predominantly sexual mode of transmission complicating pregnancy, third trimester: Secondary | ICD-10-CM

## 2023-01-18 DIAGNOSIS — O9903 Anemia complicating the puerperium: Secondary | ICD-10-CM

## 2023-01-18 LAB — CHLAMYDIA/NGC RT PCR (ARMC ONLY)
Chlamydia Tr: NOT DETECTED
N gonorrhoeae: NOT DETECTED

## 2023-01-18 LAB — TYPE AND SCREEN

## 2023-01-18 LAB — RPR: RPR Ser Ql: NONREACTIVE

## 2023-01-18 SURGERY — Surgical Case
Anesthesia: Epidural

## 2023-01-18 MED ORDER — SODIUM CHLORIDE 0.9 % IV SOLN
INTRAVENOUS | Status: DC | PRN
  Administered 2023-01-18 (×2): 5 mL via EPIDURAL

## 2023-01-18 MED ORDER — FENTANYL CITRATE (PF) 100 MCG/2ML IJ SOLN
INTRAMUSCULAR | Status: AC
  Filled 2023-01-18: qty 2

## 2023-01-18 MED ORDER — SODIUM CHLORIDE 0.9 % IV SOLN
500.0000 mg | Freq: Once | INTRAVENOUS | Status: AC
  Administered 2023-01-18 (×2): 500 mg via INTRAVENOUS

## 2023-01-18 MED ORDER — CEFAZOLIN IN SODIUM CHLORIDE 3-0.9 GM/100ML-% IV SOLN
INTRAVENOUS | Status: AC
  Filled 2023-01-18: qty 100

## 2023-01-18 MED ORDER — SENNOSIDES-DOCUSATE SODIUM 8.6-50 MG PO TABS
2.0000 | ORAL_TABLET | Freq: Every day | ORAL | Status: DC
  Administered 2023-01-19 – 2023-01-20 (×2): 2 via ORAL
  Filled 2023-01-18 (×2): qty 2

## 2023-01-18 MED ORDER — MORPHINE SULFATE (PF) 0.5 MG/ML IJ SOLN
INTRAMUSCULAR | Status: AC
  Filled 2023-01-18: qty 10

## 2023-01-18 MED ORDER — SODIUM CHLORIDE 0.9% FLUSH
INTRAVENOUS | Status: DC | PRN
  Administered 2023-01-18: 20 mL via INTRAVENOUS

## 2023-01-18 MED ORDER — MORPHINE SULFATE (PF) 0.5 MG/ML IJ SOLN
INTRAMUSCULAR | Status: DC | PRN
  Administered 2023-01-18: .1 mg via INTRATHECAL

## 2023-01-18 MED ORDER — PHENYLEPHRINE HCL-NACL 20-0.9 MG/250ML-% IV SOLN
INTRAVENOUS | Status: DC | PRN
  Administered 2023-01-18: 30 ug/min via INTRAVENOUS

## 2023-01-18 MED ORDER — SOD CITRATE-CITRIC ACID 500-334 MG/5ML PO SOLN
ORAL | Status: AC
  Filled 2023-01-18: qty 15

## 2023-01-18 MED ORDER — OXYTOCIN-SODIUM CHLORIDE 30-0.9 UT/500ML-% IV SOLN: 1.0000 m[IU]/min | INTRAVENOUS | Status: DC

## 2023-01-18 MED ORDER — ENOXAPARIN SODIUM 40 MG/0.4ML IJ SOSY
40.0000 mg | PREFILLED_SYRINGE | INTRAMUSCULAR | Status: DC
  Administered 2023-01-19: 40 mg via SUBCUTANEOUS
  Filled 2023-01-18 (×2): qty 0.4

## 2023-01-18 MED ORDER — LIDOCAINE-EPINEPHRINE (PF) 1.5 %-1:200000 IJ SOLN
INTRAMUSCULAR | Status: DC | PRN
  Administered 2023-01-18: 3 mL via PERINEURAL

## 2023-01-18 MED ORDER — SODIUM CHLORIDE 0.9 % IV SOLN
500.0000 mg | INTRAVENOUS | Status: DC
  Filled 2023-01-18: qty 5

## 2023-01-18 MED ORDER — OXYCODONE HCL 5 MG PO TABS
5.0000 mg | ORAL_TABLET | ORAL | Status: DC | PRN
Start: 2023-01-18 — End: 2023-01-20
  Administered 2023-01-19: 10 mg via ORAL
  Administered 2023-01-19: 5 mg via ORAL
  Administered 2023-01-20: 10 mg via ORAL
  Filled 2023-01-18: qty 1
  Filled 2023-01-18 (×2): qty 2

## 2023-01-18 MED ORDER — MEPERIDINE HCL 25 MG/ML IJ SOLN
6.2500 mg | INTRAMUSCULAR | Status: DC | PRN
Start: 2023-01-18 — End: 2023-01-18

## 2023-01-18 MED ORDER — COCONUT OIL OIL: 1.0000 | TOPICAL_OIL | Status: DC | PRN

## 2023-01-18 MED ORDER — PHENYLEPHRINE 80 MCG/ML (10ML) SYRINGE FOR IV PUSH (FOR BLOOD PRESSURE SUPPORT)
PREFILLED_SYRINGE | INTRAVENOUS | Status: AC
  Filled 2023-01-18: qty 10

## 2023-01-18 MED ORDER — SCOPOLAMINE 1 MG/3DAYS TD PT72
1.0000 | MEDICATED_PATCH | Freq: Once | TRANSDERMAL | Status: DC
  Administered 2023-01-18: 1.5 mg via TRANSDERMAL
  Filled 2023-01-18: qty 1

## 2023-01-18 MED ORDER — SODIUM CHLORIDE 0.9% FLUSH
3.0000 mL | INTRAVENOUS | Status: DC | PRN
Start: 2023-01-18 — End: 2023-01-20

## 2023-01-18 MED ORDER — CEFAZOLIN IN SODIUM CHLORIDE 3-0.9 GM/100ML-% IV SOLN
3.0000 g | Freq: Once | INTRAVENOUS | Status: AC
  Administered 2023-01-18: 3 g via INTRAVENOUS

## 2023-01-18 MED ORDER — IBUPROFEN 600 MG PO TABS
600.0000 mg | ORAL_TABLET | Freq: Four times a day (QID) | ORAL | Status: DC
  Administered 2023-01-19 – 2023-01-20 (×4): 600 mg via ORAL
  Filled 2023-01-18 (×3): qty 1

## 2023-01-18 MED ORDER — LIDOCAINE HCL (PF) 1 % IJ SOLN
INTRAMUSCULAR | Status: AC
  Filled 2023-01-18: qty 30

## 2023-01-18 MED ORDER — BUPIVACAINE HCL 0.25 % IJ SOLN
INTRAMUSCULAR | Status: DC | PRN
  Administered 2023-01-18: 37 mL

## 2023-01-18 MED ORDER — PROPOFOL 10 MG/ML IV BOLUS
INTRAVENOUS | Status: AC
  Filled 2023-01-18: qty 20

## 2023-01-18 MED ORDER — CALCIUM CARBONATE ANTACID 500 MG PO CHEW
2.0000 | CHEWABLE_TABLET | ORAL | Status: DC | PRN
  Administered 2023-01-18: 400 mg via ORAL
  Filled 2023-01-18: qty 2

## 2023-01-18 MED ORDER — ONDANSETRON HCL 4 MG/2ML IJ SOLN
INTRAMUSCULAR | Status: AC
  Filled 2023-01-18: qty 2

## 2023-01-18 MED ORDER — DIPHENHYDRAMINE HCL 50 MG/ML IJ SOLN
25.0000 mg | Freq: Once | INTRAMUSCULAR | Status: AC
  Administered 2023-01-18: 25 mg via INTRAVENOUS
  Filled 2023-01-18: qty 1

## 2023-01-18 MED ORDER — PHENYLEPHRINE 80 MCG/ML (10ML) SYRINGE FOR IV PUSH (FOR BLOOD PRESSURE SUPPORT)
PREFILLED_SYRINGE | INTRAVENOUS | Status: DC | PRN
  Administered 2023-01-18 (×2): 80 ug via INTRAVENOUS

## 2023-01-18 MED ORDER — DEXAMETHASONE SODIUM PHOSPHATE 10 MG/ML IJ SOLN
INTRAMUSCULAR | Status: DC | PRN
  Administered 2023-01-18: 10 mg via INTRAVENOUS

## 2023-01-18 MED ORDER — DEXAMETHASONE SODIUM PHOSPHATE 10 MG/ML IJ SOLN
INTRAMUSCULAR | Status: AC
  Filled 2023-01-18: qty 1

## 2023-01-18 MED ORDER — SIMETHICONE 80 MG PO CHEW
80.0000 mg | CHEWABLE_TABLET | Freq: Three times a day (TID) | ORAL | Status: DC
  Administered 2023-01-19 – 2023-01-20 (×3): 80 mg via ORAL
  Filled 2023-01-18 (×4): qty 1

## 2023-01-18 MED ORDER — AMMONIA AROMATIC IN INHA
RESPIRATORY_TRACT | Status: AC
  Filled 2023-01-18: qty 10

## 2023-01-18 MED ORDER — PRENATAL MULTIVITAMIN CH
1.0000 | ORAL_TABLET | Freq: Every day | ORAL | Status: DC
  Administered 2023-01-19 – 2023-01-20 (×2): 1 via ORAL
  Filled 2023-01-18 (×2): qty 1

## 2023-01-18 MED ORDER — DIBUCAINE (PERIANAL) 1 % EX OINT: 1.0000 | TOPICAL_OINTMENT | CUTANEOUS | Status: DC | PRN

## 2023-01-18 MED ORDER — ACETAMINOPHEN 500 MG PO TABS
1000.0000 mg | ORAL_TABLET | Freq: Four times a day (QID) | ORAL | Status: DC
  Administered 2023-01-18 – 2023-01-20 (×7): 1000 mg via ORAL
  Filled 2023-01-18 (×7): qty 2

## 2023-01-18 MED ORDER — SOD CITRATE-CITRIC ACID 500-334 MG/5ML PO SOLN
30.0000 mL | ORAL | Status: AC
  Administered 2023-01-18: 30 mL via ORAL

## 2023-01-18 MED ORDER — OXYCODONE HCL 5 MG PO TABS: 5.0000 mg | ORAL_TABLET | Freq: Four times a day (QID) | ORAL | Status: DC | PRN

## 2023-01-18 MED ORDER — ZOLPIDEM TARTRATE 5 MG PO TABS: 5.0000 mg | ORAL_TABLET | Freq: Every evening | ORAL | Status: DC | PRN

## 2023-01-18 MED ORDER — KETOROLAC TROMETHAMINE 30 MG/ML IJ SOLN
30.0000 mg | Freq: Four times a day (QID) | INTRAMUSCULAR | Status: AC
  Administered 2023-01-18 – 2023-01-19 (×3): 30 mg via INTRAVENOUS
  Filled 2023-01-18 (×3): qty 1

## 2023-01-18 MED ORDER — MENTHOL 3 MG MT LOZG
1.0000 | LOZENGE | OROMUCOSAL | Status: DC | PRN
Start: 2023-01-18 — End: 2023-01-20

## 2023-01-18 MED ORDER — BUPIVACAINE IN DEXTROSE 0.75-8.25 % IT SOLN
INTRATHECAL | Status: DC | PRN
  Administered 2023-01-18: 1.4 mL via INTRATHECAL

## 2023-01-18 MED ORDER — BUPIVACAINE HCL (PF) 0.25 % IJ SOLN
INTRAMUSCULAR | Status: AC
  Filled 2023-01-18: qty 60

## 2023-01-18 MED ORDER — MISOPROSTOL 200 MCG PO TABS
ORAL_TABLET | ORAL | Status: AC
  Filled 2023-01-18: qty 4

## 2023-01-18 MED ORDER — ACETAMINOPHEN 500 MG PO TABS: 1000.0000 mg | ORAL_TABLET | Freq: Four times a day (QID) | ORAL | Status: DC

## 2023-01-18 MED ORDER — PHENYLEPHRINE HCL-NACL 20-0.9 MG/250ML-% IV SOLN
INTRAVENOUS | Status: AC
  Filled 2023-01-18: qty 250

## 2023-01-18 MED ORDER — DIPHENHYDRAMINE HCL 50 MG/ML IJ SOLN: 12.5000 mg | INTRAMUSCULAR | Status: DC | PRN

## 2023-01-18 MED ORDER — NALOXONE HCL 4 MG/10ML IJ SOLN: 1.0000 ug/kg/h | INTRAVENOUS | Status: DC | PRN

## 2023-01-18 MED ORDER — FENTANYL CITRATE (PF) 100 MCG/2ML IJ SOLN
INTRAMUSCULAR | Status: DC | PRN
  Administered 2023-01-18: 10 ug via INTRATHECAL

## 2023-01-18 MED ORDER — SIMETHICONE 80 MG PO CHEW: 80.0000 mg | CHEWABLE_TABLET | ORAL | Status: DC | PRN

## 2023-01-18 MED ORDER — DIPHENHYDRAMINE HCL 25 MG PO CAPS: 25.0000 mg | ORAL_CAPSULE | ORAL | Status: DC | PRN

## 2023-01-18 MED ORDER — MORPHINE SULFATE (PF) 2 MG/ML IV SOLN
1.0000 mg | INTRAVENOUS | Status: DC | PRN
Start: 2023-01-18 — End: 2023-01-20

## 2023-01-18 MED ORDER — OXYTOCIN-SODIUM CHLORIDE 30-0.9 UT/500ML-% IV SOLN
2.5000 [IU]/h | INTRAVENOUS | Status: AC
  Administered 2023-01-18: 2.5 [IU]/h via INTRAVENOUS

## 2023-01-18 MED ORDER — WITCH HAZEL-GLYCERIN EX PADS: 1.0000 | MEDICATED_PAD | CUTANEOUS | Status: DC | PRN

## 2023-01-18 MED ORDER — NALOXONE HCL 0.4 MG/ML IJ SOLN: 0.4000 mg | INTRAMUSCULAR | Status: DC | PRN

## 2023-01-18 MED ORDER — ONDANSETRON HCL 4 MG/2ML IJ SOLN: 4.0000 mg | Freq: Three times a day (TID) | INTRAMUSCULAR | Status: DC | PRN

## 2023-01-18 MED ORDER — CEFAZOLIN SODIUM-DEXTROSE 2-4 GM/100ML-% IV SOLN: 2.0000 g | INTRAVENOUS | Status: DC

## 2023-01-18 MED ORDER — KETOROLAC TROMETHAMINE 30 MG/ML IJ SOLN: 30.0000 mg | Freq: Four times a day (QID) | INTRAMUSCULAR | Status: AC | PRN

## 2023-01-18 MED ORDER — DIPHENHYDRAMINE HCL 25 MG PO CAPS
25.0000 mg | ORAL_CAPSULE | Freq: Four times a day (QID) | ORAL | Status: DC | PRN
  Filled 2023-01-18: qty 1

## 2023-01-18 MED ORDER — GABAPENTIN 300 MG PO CAPS
300.0000 mg | ORAL_CAPSULE | Freq: Every day | ORAL | Status: DC
Start: 2023-01-18 — End: 2023-01-20
  Administered 2023-01-18 – 2023-01-19 (×2): 300 mg via ORAL
  Filled 2023-01-18 (×2): qty 1

## 2023-01-18 MED ORDER — KETOROLAC TROMETHAMINE 30 MG/ML IJ SOLN
30.0000 mg | Freq: Four times a day (QID) | INTRAMUSCULAR | Status: AC | PRN
Start: 2023-01-18 — End: 2023-01-19

## 2023-01-18 MED ORDER — METHYLERGONOVINE MALEATE 0.2 MG/ML IJ SOLN
INTRAMUSCULAR | Status: DC | PRN
  Administered 2023-01-18: .2 mg via INTRAMUSCULAR

## 2023-01-18 MED ORDER — OXYTOCIN 10 UNIT/ML IJ SOLN
INTRAMUSCULAR | Status: AC
  Filled 2023-01-18: qty 2

## 2023-01-18 SURGICAL SUPPLY — 36 items
APL PRP STRL LF DISP 70% ISPRP (MISCELLANEOUS) ×1
BARRIER ADHS 3X4 INTERCEED (GAUZE/BANDAGES/DRESSINGS) ×1 IMPLANT
BRR ADH 4X3 ABS CNTRL BYND (GAUZE/BANDAGES/DRESSINGS) ×1
CHLORAPREP W/TINT 26 (MISCELLANEOUS) ×1 IMPLANT
DRSG TELFA 3X8 NADH (GAUZE/BANDAGES/DRESSINGS) ×1 IMPLANT
DRSG TELFA 3X8 NADH STRL (GAUZE/BANDAGES/DRESSINGS) ×1 IMPLANT
ELECT CAUTERY BLADE 6.4 (BLADE) ×1 IMPLANT
ELECT REM PT RETURN 9FT ADLT (ELECTROSURGICAL) ×1
ELECTRODE REM PT RTRN 9FT ADLT (ELECTROSURGICAL) ×1 IMPLANT
EXTRACTOR VACUUM KIWI (MISCELLANEOUS) IMPLANT
GAUZE SPONGE 4X4 12PLY STRL (GAUZE/BANDAGES/DRESSINGS) ×1 IMPLANT
GLOVE SURG SYN 8.0 (GLOVE) ×1 IMPLANT
GLOVE SURG SYN 8.0 PF PI (GLOVE) ×1 IMPLANT
GOWN STRL REUS W/ TWL LRG LVL3 (GOWN DISPOSABLE) ×2 IMPLANT
GOWN STRL REUS W/ TWL XL LVL3 (GOWN DISPOSABLE) ×1 IMPLANT
GOWN STRL REUS W/TWL LRG LVL3 (GOWN DISPOSABLE) ×2
GOWN STRL REUS W/TWL XL LVL3 (GOWN DISPOSABLE) ×1
MANIFOLD NEPTUNE II (INSTRUMENTS) ×1 IMPLANT
MAT PREVALON FULL STRYKER (MISCELLANEOUS) ×1 IMPLANT
NDL HYPO 22X1.5 SAFETY MO (MISCELLANEOUS) ×1 IMPLANT
NEEDLE HYPO 22X1.5 SAFETY MO (MISCELLANEOUS) ×1 IMPLANT
NS IRRIG 1000ML POUR BTL (IV SOLUTION) ×1 IMPLANT
PACK C SECTION AR (MISCELLANEOUS) ×1 IMPLANT
PAD DRESSING TELFA 3X8 NADH (GAUZE/BANDAGES/DRESSINGS) IMPLANT
PAD OB MATERNITY 4.3X12.25 (PERSONAL CARE ITEMS) ×1 IMPLANT
PAD PREP OB/GYN DISP 24X41 (PERSONAL CARE ITEMS) ×1 IMPLANT
SCRUB CHG 4% DYNA-HEX 4OZ (MISCELLANEOUS) ×1 IMPLANT
STRAP SAFETY 5IN WIDE (MISCELLANEOUS) ×1 IMPLANT
SUCT VACUUM KIWI BELL (SUCTIONS) IMPLANT
SUT CHROMIC 1 CTX 36 (SUTURE) ×3 IMPLANT
SUT PLAIN GUT 0 (SUTURE) ×2 IMPLANT
SUT VIC AB 0 CT1 36 (SUTURE) ×2 IMPLANT
SUT VICRYL 2-0 SH 8X27 (SUTURE) IMPLANT
SYR 30ML LL (SYRINGE) ×2 IMPLANT
TRAP FLUID SMOKE EVACUATOR (MISCELLANEOUS) ×1 IMPLANT
WATER STERILE IRR 500ML POUR (IV SOLUTION) ×1 IMPLANT

## 2023-01-18 NOTE — Anesthesia Procedure Notes (Signed)
Epidural Patient location during procedure: OB Start time: 01/18/2023 1:16 PM End time: 01/18/2023 1:43 PM  Staffing Anesthesiologist: Stephanie Coup, MD Performed: anesthesiologist   Preanesthetic Checklist Completed: patient identified, IV checked, site marked, risks and benefits discussed, surgical consent, monitors and equipment checked, pre-op evaluation and timeout performed  Epidural Patient position: sitting Prep: Betadine Patient monitoring: heart rate, continuous pulse ox and blood pressure Approach: midline Location: L3-L4 Injection technique: LOR saline  Needle:  Needle type: Tuohy  Needle gauge: 18 G Needle length: 9 cm and 9 Needle insertion depth: 6 cm Catheter type: closed end flexible Catheter size: 20 Guage Catheter at skin depth: 11 cm Test dose: negative and 1.5% lidocaine with Epi 1:200 K  Assessment Sensory level: T4 Events: blood not aspirated, no cerebrospinal fluid, injection not painful, no injection resistance, no paresthesia and negative IV test  Additional Notes 2 attempt Pt. Evaluated and documentation done after procedure finished. Patient identified. Risks/Benefits/Options discussed with patient including but not limited to bleeding, infection, nerve damage, paralysis, failed block, incomplete pain control, headache, blood pressure changes, nausea, vomiting, reactions to medication both or allergic, itching and postpartum back pain. Confirmed with bedside nurse the patient's most recent platelet count. Confirmed with patient that they are not currently taking any anticoagulation, have any bleeding history or any family history of bleeding disorders. Patient expressed understanding and wished to proceed. All questions were answered. Sterile technique was used throughout the entire procedure. Please see nursing notes for vital signs. Test dose was given through epidural catheter and negative prior to continuing to dose epidural or start infusion.  Warning signs of high block given to the patient including shortness of breath, tingling/numbness in hands, complete motor block, or any concerning symptoms with instructions to call for help. Patient was given instructions on fall risk and not to get out of bed. All questions and concerns addressed with instructions to call with any issues or inadequate analgesia.    Patient tolerated the insertion well without immediate complications. Reason for block:procedure for pain

## 2023-01-18 NOTE — Progress Notes (Addendum)
After a long discussion, Maureen Gutierrez and Maureen Gutierrez have decided to proceed with cesarean birth. Questions answered. Pitocin discontinued. Dr. Feliberto Gottron informed of decision.  Glenetta Borg, CNM

## 2023-01-18 NOTE — Anesthesia Procedure Notes (Addendum)
Spinal  Patient location during procedure: OR Start time: 01/18/2023 5:20 PM End time: 01/18/2023 5:28 PM Reason for block: surgical anesthesia Staffing Performed: anesthesiologist and resident/CRNA  Anesthesiologist: Stephanie Coup, MD Resident/CRNA: Stormy Fabian, CRNA Performed by: Stormy Fabian, CRNA Authorized by: Stephanie Coup, MD   Preanesthetic Checklist Completed: patient identified, IV checked, site marked, risks and benefits discussed, surgical consent, monitors and equipment checked, pre-op evaluation and timeout performed Spinal Block Patient position: sitting Prep: ChloraPrep Patient monitoring: heart rate, continuous pulse ox, blood pressure and cardiac monitor Approach: midline Location: L3-4 Injection technique: single-shot Needle Needle type: Whitacre and Introducer  Needle gauge: 22 G Needle length: 9 cm Assessment Sensory level: T10 Events: CSF return Additional Notes Sterile aseptic technique used throughout the procedure.  Negative paresthesia. Negative blood return. Positive free-flowing CSF. Expiration date of kit checked and confirmed. Patient tolerated procedure well, without complications.

## 2023-01-18 NOTE — Progress Notes (Signed)
Labor Progress Note   ASSESSMENT/PLAN   Maureen Gutierrez 22 y.o.   G1P0000  at [redacted]w[redacted]d here for IOL for PROM.  FWB:  - Fetal well being assessed: Category 1       GBS: - GBS positive, adequately treated  LABOR: - Prolonged latent labor, coping well. Cervical exam unchanged from prior exam. - Now comfortable with epidural - Pitocin break completed. Will titrate Pitocin to adequate contractions. - Discussed again possibility of and indications for Cesarean birth. In the context of category 1 FHR tracing, no s/s of infection, and maternal desire to continue labor, agree with plan to continue to proceed with Pitocin augmentation at this time. - Anticipate SVD   Labor Progress  Principal Problem:   Normal labor Active Problems:   Encounter for supervision of normal first pregnancy in third trimester     Overview: Clinical Staff Provider      Office Location  [facility] Dating  Not found.      Language  English Anatomy US   normal female      Flu Vaccine  12/27/22 Genetic Screen  NIPS: neg, xy      TDaP vaccine   11/29/22 Hgb A1C or      GTT Early :     Third trimester :       Covid Yes,has had    LAB RESULTS       Rhogam     Blood Type A+      Feeding Plan breast Antibody neg      Contraception Unsure Rubella immune      Circumcision yes RPR NON-REACTIVE (02/05 1030)       Pediatrician  unsure HBsAg   neg      Support Person boyfriend HIV NON-REACTIVE (02/05 1030)      Prenatal Classes Is interested  Varicella  immune          GBS  (For PCN allergy, check sensitivities)       BTL Consent   Hep C         VBAC Consent   Pap Last labs      No results found for: "DIAGPAP"                Hgb Electro             CF            SMA                              Obesity affecting pregnancy in third trimester   SUBJECTIVE/OBJECTIVE   SUBJECTIVE:  Maureen Gutierrez is comfortable now with her epidural and hopes to get some rest.     OBJECTIVE: Vital Signs: Patient Vitals for the past  12 hrs:  BP Temp Temp src Pulse Resp SpO2  01/18/23 1400 135/69 -- -- 86 -- 97 %  01/18/23 1355 134/67 -- -- 94 -- 96 %  01/18/23 1350 132/67 -- -- (!) 103 -- 95 %  01/18/23 1345 127/69 -- -- 92 -- 94 %  01/18/23 1340 (!) 140/83 -- -- (!) 102 -- 95 %  01/18/23 1338 (!) 140/90 -- -- 92 -- --  01/18/23 1335 -- -- -- -- -- 98 %  01/18/23 1215 (!) 147/90 97.8 F (36.6 C) Oral 86 20 --  01/18/23 0925 -- 98 F (36.7 C) Oral -- -- --  01/18/23 0706 (!) 139/94 97.9 F (36.6 C) Oral 91  18 --  01/18/23 0504 -- 98.9 F (37.2 C) Axillary -- -- --  01/18/23 0328 -- 98.2 F (36.8 C) Oral -- -- --    Last SVE:  Dilation: 4 Effacement (%): 70 Station: -1 Presentation: Vertex Exam by:: Aleigh Grunden CNM - Rupture Date: 01/17/23, Rupture Time: 1800,    FHR:   - Mode: External  - Baseline: 145 bpm  - Moderate variability   - Characteristics (ie - accels, decels): Accelerations: 15 x 15, no decels  UTERINE ACTIVITY:   - Mode: Intrauterine pressure catheter  - Contraction Frequency (min): 2-5 minutes  Glenetta Borg, CNM

## 2023-01-18 NOTE — Progress Notes (Signed)
Labor Progress Note   ASSESSMENT/PLAN   Maureen Gutierrez 22 y.o.   G1P0000  at [redacted]w[redacted]d here with PROM.  FWB:  - Fetal well being assessed: Cat I    GBS: - GBS positive, now with adequate treatment with penicillin  LABOR: - Now in early labor, doing well. - Pain Management: position changes.  - Feeling uncomfortable with back pain. Discussed pain management options with patient including IV fentanyl and epidural. Desires to wait on epidural until labor has progressed further but is uncomfortable with IV fentanyl. She would like something to help her rest and is open to trying some IV benadryl which has been ordered. We discussed whether an SVE would be helpful at this time given that she is still not very uncomfortable. With shared decision-making, we decided to wait on SVE until she begins to feel more actively in labor. Will continue to titrate pitocin as able.  - Anticipate SVD   Labor Progress 2020 4/50/-2  SUBJECTIVE/OBJECTIVE   SUBJECTIVE:  Feeling more exhausted and uncomfortable with back pain. States that if she were to have to push at this time, she would not have the energy.   OBJECTIVE: Vital Signs: Patient Vitals for the past 12 hrs:  BP Temp Temp src Pulse Resp Height Weight  01/17/23 2306 114/72 98.6 F (37 C) Oral (!) 103 19 5\' 9"  (1.753 m) 119.3 kg  01/17/23 2214 -- 98.6 F (37 C) Oral -- -- -- --    Last SVE:  Dilation: 4 Effacement (%): 50 Station: -2 Presentation: Vertex Exam by:: Huntley Dec Taje Tondreau CNM -  ,  ,  ,    FHR:   - Mode: External  - Baseline Rate (A): 155 bpm  -    - Characteristics (ie - accels, decels): Accelerations: 15 x 15  -    UTERINE ACTIVITY:   - Mode: Toco  - Contraction Frequency (min): 3.5-5 minutes Pitocin: 6 mU/min

## 2023-01-18 NOTE — Progress Notes (Signed)
Labor Progress Note   ASSESSMENT/PLAN   Maureen Gutierrez 22 y.o.   G1P0000  at [redacted]w[redacted]d here for PROM.  FWB:  - Fetal well being assessed: Category 1       GBS: - GBS positive, adequately treated  LABOR: -  Prolonged latent labor - Category 1 tracing and no s/s of infection - Discussed options with patient and plan for sweep, increasing Pitocin to 30 mU/min, place IUPC. Consider 30-minute Pitocin break and restarting at half if contractions are not adequate at 30 mUmin -Discussed possibility of cesarean birth if adequate contractions or cervical change are not achieved. - Pain Management: Labor support without medications - Membrane sweep performed - Anticipate SVD   Labor Progress  Principal Problem:   Normal labor Active Problems:   Encounter for supervision of normal first pregnancy in third trimester     Overview: Clinical Staff Provider      Office Location   Ob/Gyn Dating  Not found.      Language  English Anatomy US   normal female      Flu Vaccine  12/27/22 Genetic Screen  NIPS: neg, xy      TDaP vaccine   11/29/22 Hgb A1C or      GTT Early :     Third trimester :       Covid Yes,has had    LAB RESULTS       Rhogam     Blood Type A+      Feeding Plan breast Antibody neg      Contraception Unsure Rubella immune      Circumcision yes RPR NON-REACTIVE (02/05 1030)       Pediatrician  unsure HBsAg   neg      Support Person boyfriend HIV NON-REACTIVE (02/05 1030)      Prenatal Classes Is interested  Varicella  immune          GBS  (For PCN allergy, check sensitivities)       BTL Consent   Hep C         VBAC Consent   Pap Last labs      No results found for: "DIAGPAP"                Hgb Electro             CF            SMA                              Obesity affecting pregnancy in third trimester   SUBJECTIVE/OBJECTIVE   SUBJECTIVE:     OBJECTIVE: Vital Signs: Patient Vitals for the past 12 hrs:  BP Temp Temp src Pulse Resp  01/18/23 0925 -- 98 F  (36.7 C) Oral -- --  01/18/23 0706 (!) 139/94 97.9 F (36.6 C) Oral 91 18  01/18/23 0504 -- 98.9 F (37.2 C) Axillary -- --  01/18/23 0328 -- 98.2 F (36.8 C) Oral -- --    Last SVE:  Dilation: 4 Effacement (%): 70 Station: -1 Presentation: Vertex Exam by:: Marty Uy CNM -  , Rupture Date: 01/17/23, Rupture Time: 1800,    FHR:   - Mode: External  - Baseline Rate (A): 135 bpm (fht)  - Moderate variability   - Characteristics (ie - accels, decels): Accelerations: 15 x 15, no decels  - Irregular contractions  UTERINE ACTIVITY:   - Mode: Toco  - Contraction  Frequency (min): unable to assess minutes  Glenetta Borg, CNM

## 2023-01-18 NOTE — Progress Notes (Signed)
Patient requested pacifier for baby. Patient was given risk and benefits by RN.

## 2023-01-18 NOTE — Progress Notes (Signed)
COvering attending for AOB . Pt with ROM 20 hrs and has not changed her cervix from 4 cm despite IV pItocin and IUPC .  I have spoken to her about the role of LTCS . She understands the indication . Proceed . The risks of cesarean section discussed with the patient included but were not limited to: bleeding which may require transfusion or reoperation; infection which may require antibiotics; injury to bowel, bladder, ureters or other surrounding organs; injury to the fetus; need for additional procedures including hysterectomy in the event of a life-threatening hemorrhage; placental abnormalities wth subsequent pregnancies, incisional problems, thromboembolic phenomenon and other postoperative/anesthesia complications. The patient concurred with the proposed plan, giving informed written consent for the procedure.  . Anesthesia and OR aware. Preoperative prophylactic antibiotics and SCDs ordered on call to the OR.  To OR when ready.

## 2023-01-18 NOTE — Anesthesia Procedure Notes (Addendum)
Date/Time: 01/18/2023 5:07 PM  Performed by: Stormy Fabian, CRNAPre-anesthesia Checklist: Patient identified, Emergency Drugs available, Suction available and Patient being monitored Patient Re-evaluated:Patient Re-evaluated prior to induction Oxygen Delivery Method: Nasal cannula Induction Type: IV induction Dental Injury: Teeth and Oropharynx as per pre-operative assessment  Comments: Nasal cannula with etCO2 monitoring

## 2023-01-18 NOTE — Brief Op Note (Signed)
01/18/2023  6:28 PM  PATIENT:  Maureen Gutierrez  22 y.o. female  PRE-OPERATIVE DIAGNOSIS:  Active phase arrest   POST-OPERATIVE DIAGNOSIS:  Active phase arrest   PROCEDURE:  primary LTCS   SURGEON:  Surgeons and Role:    * Kosei Rhodes, Ihor Austin, MD - Primary  PHYSICIAN ASSISTANT: CNM Swanson   ASSISTANTS: none   ANESTHESIA:   spinal  EBL:  qbl : 570cc IOF 900 cc ou 100cc  BLOOD ADMINISTERED:none  DRAINS: Urinary Catheter (Foley)   LOCAL MEDICATIONS USED:  MARCAINE     SPECIMEN:  No Specimen  DISPOSITION OF SPECIMEN:  PATHOLOGY  COUNTS:  YES  TOURNIQUET:  * No tourniquets in log *  DICTATION: .Other Dictation: Dictation Number verbal   PLAN OF CARE: Admit to inpatient   PATIENT DISPOSITION:  PACU - hemodynamically stable.   Delay start of Pharmacological VTE agent (>24hrs) due to surgical blood loss or risk of bleeding: not applicable

## 2023-01-18 NOTE — Progress Notes (Signed)
Labor Progress Note   ASSESSMENT/PLAN   Aadhya Busic 22 y.o.   G1P0000  at [redacted]w[redacted]d here for IOL for PROM.  FWB:  - Fetal well being assessed: Category 1       GBS: - GBS positive, adequately treated  LABOR: -  Prolonged latent labor -  Minimal change in effacement but no significant cervical change since admission  - Reviewed options with Woodie and her family. Discussed prolonged latent labor and no cervical change despite SROM x 22 hours and Pitocin for 14 hours. Recommend cesarean delivery at this point, but it is not unsafe to continue induction if that is her preference. We discussed that she is getting close to the maximum of Pitocin and there are not further interventions once we reach that. - Pain Management: Epidural - Tavaria and her family will discuss privately and then inform staff of their decision. - Dr. Feliberto Gottron updated  Principal Problem:   Normal labor Active Problems:   Encounter for supervision of normal first pregnancy in third trimester     Overview: Clinical Staff Provider      Office Location   Ob/Gyn Dating  Not found.      Language  English Anatomy US   normal female      Flu Vaccine  12/27/22 Genetic Screen  NIPS: neg, xy      TDaP vaccine   11/29/22 Hgb A1C or      GTT Early :     Third trimester :       Covid Yes,has had    LAB RESULTS       Rhogam     Blood Type A+      Feeding Plan breast Antibody neg      Contraception Unsure Rubella immune      Circumcision yes RPR NON-REACTIVE (02/05 1030)       Pediatrician  unsure HBsAg   neg      Support Person boyfriend HIV NON-REACTIVE (02/05 1030)      Prenatal Classes Is interested  Varicella  immune          GBS  (For PCN allergy, check sensitivities)       BTL Consent   Hep C         VBAC Consent   Pap Last labs      No results found for: "DIAGPAP"                Hgb Electro             CF            SMA                              Obesity affecting pregnancy in third  trimester   SUBJECTIVE/OBJECTIVE   SUBJECTIVE:   Monic is comfortable with her epidural. She is disappointed with the lack of cervical change.   OBJECTIVE: Vital Signs: Patient Vitals for the past 12 hrs:  BP Temp Temp src Pulse Resp SpO2  01/18/23 1601 (!) 147/84 -- -- 93 -- --  01/18/23 1600 -- -- -- -- -- 99 %  01/18/23 1555 -- -- -- -- -- 100 %  01/18/23 1553 -- 98.8 F (37.1 C) Oral -- -- --  01/18/23 1550 -- -- -- -- -- 100 %  01/18/23 1530 -- -- -- -- -- 100 %  01/18/23 1515 -- -- -- -- -- 100 %  01/18/23  1500 (!) 124/58 -- -- 89 -- 100 %  01/18/23 1445 122/74 -- -- 80 -- 96 %  01/18/23 1431 124/63 -- -- 75 -- --  01/18/23 1430 -- -- -- -- -- 99 %  01/18/23 1425 -- -- -- -- -- 98 %  01/18/23 1420 -- -- -- -- -- 99 %  01/18/23 1416 127/65 -- -- 76 -- --  01/18/23 1415 -- -- -- -- -- 100 %  01/18/23 1405 -- 98.4 F (36.9 C) Oral -- -- 99 %  01/18/23 1400 135/69 -- -- 86 -- 97 %  01/18/23 1355 134/67 -- -- 94 -- 96 %  01/18/23 1350 132/67 -- -- (!) 103 -- 95 %  01/18/23 1345 127/69 -- -- 92 -- 94 %  01/18/23 1340 (!) 140/83 -- -- (!) 102 -- 95 %  01/18/23 1338 (!) 140/90 -- -- 92 -- --  01/18/23 1335 -- -- -- -- -- 98 %  01/18/23 1215 (!) 147/90 97.8 F (36.6 C) Oral 86 20 --  01/18/23 0925 -- 98 F (36.7 C) Oral -- -- --  01/18/23 0706 (!) 139/94 97.9 F (36.6 C) Oral 91 18 --  01/18/23 0504 -- 98.9 F (37.2 C) Axillary -- -- --    Last SVE:  Dilation: 4.5 Effacement (%): 90 Station: -1 Presentation: Vertex Exam by:: Kerrick Miler CNM -  , Rupture Date: 01/17/23, Rupture Time: 1800,    FHR:   - Mode: External  - Baseline Rate (A): 135 bpm  - Moderate variability  -  Contractions q2-4 mins. MVUs 110  - Characteristics (ie - accels, decels): Accelerations: 15 x 15, no dece;s   UTERINE ACTIVITY:   - Mode: Intrauterine pressure catheter  - Contraction Frequency (min): 2-4.5 minutes  Glenetta Borg, CNM

## 2023-01-18 NOTE — Progress Notes (Signed)
Labor Progress Note   ASSESSMENT/PLAN   Maureen Gutierrez 22 y.o.   G1P0000  at [redacted]w[redacted]d here with PROM.  FWB:  - Fetal well being assessed: Cat I       GBS: - GBS Positive, now adequately treated with penicillin  LABOR: - Continues with early/latent labor, feeling tired. - Pain Management: position changes. - Discussed options with patient and will continue pitocin. Reviewed pain management options again as she has not been able to get much rest overnight. She continues to desire to wait on epidural until contractions are more painful. Contractions currently palpating mild-moderate.  - Anticipate SVD   Labor Progress 2020 4/50/-2  0620 4/50/-2  SUBJECTIVE/OBJECTIVE   SUBJECTIVE:  Was not able to get much rest with benadryl. Continues to feel exhausted. Starting to feel contractions more intensely.   OBJECTIVE: Vital Signs: Patient Vitals for the past 12 hrs:  BP Temp Temp src Pulse Resp Height Weight  01/18/23 0504 -- 98.9 F (37.2 C) Axillary -- -- -- --  01/18/23 0328 -- 98.2 F (36.8 C) Oral -- -- -- --  01/17/23 2306 114/72 98.6 F (37 C) Oral (!) 103 19 5\' 9"  (1.753 m) 119.3 kg  01/17/23 2214 -- 98.6 F (37 C) Oral -- -- -- --    Last SVE:  Dilation: 4.5 Effacement (%): 50 Station: -2 Presentation: Vertex Exam by:: [employer] RN -  ,  ,  ,    FHR:   - Mode: External  - Baseline Rate (A): 135 bpm  -    - Characteristics (ie - accels, decels): Accelerations: 15 x 15  -    UTERINE ACTIVITY:   - Mode: Toco  - Contraction Frequency (min): 1-4 minutes Pitocin: 14 mU/min

## 2023-01-18 NOTE — Discharge Summary (Signed)
Postpartum Discharge Summary  Date of Service updated***     Patient Name: Maureen Gutierrez DOB: Feb 13, 2001 MRN: [id number]  Date of admission: 01/17/2023 Delivery date:01/18/2023 Delivering provider: Suzy Bouchard Date of discharge: 01/18/2023  Admitting diagnosis: Normal labor [O80, Z37.9] Intrauterine pregnancy: [redacted]w[redacted]d     Secondary diagnosis:  Principal Problem:   Normal labor Active Problems:   Encounter for supervision of normal first pregnancy in third trimester   Obesity affecting pregnancy in third trimester  Additional problems: ***    Discharge diagnosis: Term Pregnancy Delivered                                              Post partum procedures:{Postpartum procedures:23558} Augmentation: Pitocin Complications: prolonged latent phase  Hospital course: Onset of Labor With Unplanned C/S   22 y.o. yo G1P1001 at [redacted]w[redacted]d was admitted in Latent Labor with PROM on 01/17/2023. She was augmented with Pitocin with no cervical change over Patient had a labor course significant for ***. The patient went for cesarean section due to  failed induction/prolonged latent phase . Delivery details as follows: Membrane Rupture Time/Date: 6:00 PM,01/17/2023  Delivery Method:C-Section, Low Transverse Operative Delivery:Device used:Kiwi with one pop off, then bell Indication: {Indication:30122} Details of operation can be found in separate operative note. Patient had a postpartum course complicated by***.  She is ambulating,tolerating a regular diet, passing flatus, and urinating well.  Patient is discharged home in stable condition 01/18/23.  Newborn Data: Birth date:01/18/2023 Birth time:5:50 PM Gender:Female Living status:Living Apgars:8 ,9  Weight:3960 g  Magnesium Sulfate received: No BMZ received: No Rhophylac:N/A MMR:N/A T-DaP:Given prenatally Flu: Yes RSV Vaccine received: Yes Transfusion:{Transfusion received:30440034} Immunizations administered: Immunization  History  Administered Date(s) Administered   DTaP 01/09/2000, 03/19/2000, 05/09/2000, 04/10/2001, 05/28/2004   HIB (PRP-OMP) 01/09/2000, 03/19/2000, 05/09/2000, 04/10/2001   Hepatitis A 09/26/2010, 04/13/2013   Hepatitis A, Ped/Adol-2 Dose 09/26/2010, 04/13/2013   Hepatitis B 11/12/99, 12/07/1999, 08/11/2000   Hepatitis B, PED/ADOLESCENT Jun 12, 1999, 12/07/1999, 08/11/2000   Hpv-Unspecified 04/13/2013, 06/11/2013, 10/12/2013   IPV 01/09/2000, 03/19/2000, 08/11/2000, 05/28/2004   Influenza Inj Mdck Quad Pf 01/16/2018   Influenza, Seasonal, Injecte, Preservative Fre 12/27/2022   Influenza-Unspecified 04/10/2016, 12/31/2016, 03/29/2022   MMR 11/20/2000, 05/28/2004   Meningococcal Conjugate 04/13/2013   Meningococcal polysaccharide vaccine (MPSV4) 04/10/2016   Pfizer Covid-19 Vaccine Bivalent Booster 51yrs & up 12/06/2020, 12/27/2020   Pneumococcal Conjugate-13 01/09/2000, 03/19/2000, 05/09/2000, 12/14/2001   Rsv, Bivalent, Protein Subunit Rsvpref,pf Verdis Frederickson) 12/20/2022   Tdap 09/26/2010, 05/06/2022, 11/29/2022   Varicella 11/20/2000, 01/26/2007    Physical exam  Vitals:   01/18/23 1553 01/18/23 1555 01/18/23 1600 01/18/23 1601  BP:    (!) 147/84  Pulse:    93  Resp:      Temp: 98.8 F (37.1 C)     TempSrc: Oral     SpO2:  100% 99%   Weight:      Height:       General: {Exam; general:21111117} Lochia: {Desc; appropriate/inappropriate:30686::"appropriate"} Uterine Fundus: {Desc; firm/soft:30687} Incision: {Exam; incision:21111123} DVT Evaluation: {Exam; dvt:2111122} Labs: Lab Results  Component Value Date   WBC 15.3 (H) 01/17/2023   HGB 11.1 (L) 01/17/2023   HCT 33.0 (L) 01/17/2023   MCV 90.2 01/17/2023   PLT 301 01/17/2023      Latest Ref Rng & Units 12/15/2022   11:36 AM  CMP  Glucose 70 - 99 mg/dL  101   BUN 6 - 20 mg/dL 5   Creatinine 1.47 - 8.29 mg/dL 5.62   Sodium 130 - 865 mmol/L 136   Potassium 3.5 - 5.1 mmol/L 4.2   Chloride 98 - 111 mmol/L 105   CO2 22  - 32 mmol/L 19   Calcium 8.9 - 10.3 mg/dL 8.7   Total Protein 6.5 - 8.1 g/dL 6.5   Total Bilirubin 0.3 - 1.2 mg/dL 0.6   Alkaline Phos 38 - 126 U/L 68   AST 15 - 41 U/L 15   ALT 0 - 44 U/L 22    Edinburgh Score:     No data to display            After visit meds:  Allergies as of 01/18/2023   No Known Allergies   Med Rec must be completed prior to using this [facility]***        Discharge home in stable condition Infant Feeding: {Baby feeding:23562} Infant Disposition:{CHL IP OB HOME WITH HQIONG:29528} Discharge instruction: per After Visit Summary and Postpartum booklet. Activity: Advance as tolerated. Pelvic rest for 6 weeks.  Diet: {OB diet:21111121} Anticipated Birth Control: {Birth Control:23956} Postpartum Appointment:{Outpatient follow up:23559} Additional Postpartum F/U: {PP Procedure:23957} Future Appointments:No future appointments. Follow up Visit:      01/18/2023 Glenetta Borg, CNM

## 2023-01-18 NOTE — Progress Notes (Signed)
Patient up walking around and sitting on birthing ball. RN in room

## 2023-01-18 NOTE — Transfer of Care (Signed)
Immediate Anesthesia Transfer of Care Note  Patient: Maureen Gutierrez  Procedure(s) Performed: CESAREAN SECTION  Patient Location: L & D 1  Anesthesia Type:Spinal  Level of Consciousness: awake, alert , and oriented  Airway & Oxygen Therapy: Patient Spontanous Breathing  Post-op Assessment: Report given to RN and Post -op Vital signs reviewed and stable  Post vital signs: Reviewed and stable  Last Vitals:  Vitals Value Taken Time  BP 117/61 01/18/23 1835  Temp    Pulse    Resp    SpO2 97 % 01/18/23 1835    Last Pain:  Vitals:   01/18/23 1553  TempSrc: Oral  PainSc:       Patients Stated Pain Goal: 0 (01/18/23 0636)  Complications: No notable events documented.

## 2023-01-18 NOTE — Anesthesia Preprocedure Evaluation (Signed)
Anesthesia Evaluation  Patient identified by MRN, date of birth, ID band Patient awake    Reviewed: Allergy & Precautions, NPO status , Patient's Chart, lab work & pertinent test results  Airway Mallampati: III  TM Distance: >3 FB Neck ROM: full    Dental  (+) Chipped   Pulmonary neg pulmonary ROS, former smoker   Pulmonary exam normal        Cardiovascular Exercise Tolerance: Good negative cardio ROS Normal cardiovascular exam     Neuro/Psych  PSYCHIATRIC DISORDERS Anxiety        GI/Hepatic negative GI ROS,,,  Endo/Other    Renal/GU   negative genitourinary   Musculoskeletal   Abdominal   Peds  Hematology negative hematology ROS (+)   Anesthesia Other Findings Past Medical History: No date: Anxiety No date: Eczema No date: Heavy periods 02/16/2018: Menorrhagia with regular cycle No date: Ovarian cyst     Comment:  left - [name] No date: Painful menstrual periods  Past Surgical History: No date: TONSILLECTOMY AND ADENOIDECTOMY  BMI    Body Mass Index: 38.84 kg/m      Reproductive/Obstetrics (+) Pregnancy                             Anesthesia Physical Anesthesia Plan  ASA: 2  Anesthesia Plan: Epidural   Post-op Pain Management: Epidural*   Induction:   PONV Risk Score and Plan: 2  Airway Management Planned: Natural Airway  Additional Equipment:   Intra-op Plan:   Post-operative Plan:   Informed Consent: I have reviewed the patients History and Physical, chart, labs and discussed the procedure including the risks, benefits and alternatives for the proposed anesthesia with the patient or authorized representative who has indicated his/her understanding and acceptance.     Dental Advisory Given  Plan Discussed with: Anesthesiologist  Anesthesia Plan Comments: (Patient reports no bleeding problems and no anticoagulant use.   Patient  consented for risks of anesthesia including but not limited to:  - adverse reactions to medications - risk of bleeding, infection and or nerve damage from epidural that could lead to paralysis - risk of headache or failed epidural - nerve damage due to positioning - that if epidural is used for C-section that there is a chance of epidural failure requiring spinal placement or conversion to GA - Damage to heart, brain, lungs, other parts of body or loss of life  Patient voiced understanding and assent.)       Anesthesia Quick Evaluation

## 2023-01-18 NOTE — Progress Notes (Signed)
Patient ID: Maureen Gutierrez, female   DOB: 08/24/2000, 22 y.o.   MRN: [id number] Updated by notes and cnm on progress . Now ROM for 15 hrs and cx last check at still 4cm .  Pitocin at 28 mu/ min with inadequate CTX pattern by toco. Cat 1 Fetal monitoring  A: protracted latent labor, ruptured  P: Spoke with CNM Swanson about IUPC placement and attempt to get pt into adequate ctx pattern . If no additional change > 16 hrs I will speak with pt about Primary LTCS

## 2023-01-19 DIAGNOSIS — D62 Acute posthemorrhagic anemia: Secondary | ICD-10-CM | POA: Insufficient documentation

## 2023-01-19 LAB — CBC
HCT: 26.6 % — ABNORMAL LOW (ref 36.0–46.0)
Hemoglobin: 8.8 g/dL — ABNORMAL LOW (ref 12.0–15.0)
MCH: 30.2 pg (ref 26.0–34.0)
MCHC: 33.1 g/dL (ref 30.0–36.0)
MCV: 91.4 fL (ref 80.0–100.0)
Platelets: 236 10*3/uL (ref 150–400)
RBC: 2.91 MIL/uL — ABNORMAL LOW (ref 3.87–5.11)
RDW: 13.4 % (ref 11.5–15.5)
WBC: 14.7 10*3/uL — ABNORMAL HIGH (ref 4.0–10.5)
nRBC: 0 % (ref 0.0–0.2)

## 2023-01-19 MED ORDER — FERROUS SULFATE 325 (65 FE) MG PO TABS
325.0000 mg | ORAL_TABLET | Freq: Every day | ORAL | Status: DC
  Administered 2023-01-19 – 2023-01-20 (×2): 325 mg via ORAL
  Filled 2023-01-19 (×2): qty 1

## 2023-01-19 MED ORDER — LIDOCAINE 5 % EX PTCH
1.0000 | MEDICATED_PATCH | CUTANEOUS | Status: DC
  Administered 2023-01-19: 1 via TRANSDERMAL
  Filled 2023-01-19: qty 1

## 2023-01-19 NOTE — Discharge Instructions (Signed)
Call office if you have any of the following:  -Persistent headache or visual changes (possible high blood pressure) -Fever >101.0 F or chills (possible infection) -Breast concerns (engorgement, mastitis) -Excessive vaginal bleeding (soaking through more than one pad in 1 hr x 2 hr) -Incision drainage/redness/increased pain/warmth at site (possible infection)  -Leg pain or redness (possible blood clot) -Depression/anxiety increased symptoms 2 weeks after delivery  Activity & Hygiene: -Do not lift > 10 lbs for 6 weeks.  -No intercourse or tampons for 6 weeks.  -No swimming pools, hot tubs or tub baths- showers only for 6 weeks **No driving for 1-2 weeks or while taking pain medication after c-section  -It is normal to bleed for up to 6 weeks. You should not soak through more than 1 pad in 1 hour x 2 hours.  Breastfeeding: -Continue prenatal vitamin.  -Increase calories and fluids.  -Your milk will come in, in the next couple of days (right now it is colostrum).  -You may have a slight fever when your milk comes in, but it should go away on its own.   -If it does not, and rises above 101 F please call the doctor.  -You will also feel achy and your breasts will be firm. They will also start to leak.  *For breastfeeding concerns, the lactation consultant can be reached at [phone]  Not Breastfeeding: -Avoid breast stimulation and wear supportive bra -ICE helps decrease inflammation and pain -Express milk for comfort by hand, do not empty breast  Postpartum blues: -feelings of happy one minute and sad another minute are normal for the first few weeks. -if it gets worse please let your doctor know. It is very common!!

## 2023-01-19 NOTE — Anesthesia Postprocedure Evaluation (Signed)
Anesthesia Post Note  Patient: Sales executive  Procedure(s) Performed: CESAREAN SECTION  Patient location during evaluation: Mother Baby Anesthesia Type: Epidural Level of consciousness: awake and alert Pain management: pain level controlled Vital Signs Assessment: post-procedure vital signs reviewed and stable Respiratory status: spontaneous breathing, nonlabored ventilation and respiratory function stable Cardiovascular status: stable Postop Assessment: no headache, no backache, epidural receding, adequate PO intake and able to ambulate Anesthetic complications: no   No notable events documented.   Last Vitals:  Vitals:   01/19/23 0802 01/19/23 1151  BP: 103/62 (!) 99/52  Pulse: 90 75  Resp: 16 16  Temp: 36.8 C 36.5 C  SpO2: 97% 100%    Last Pain:  Vitals:   01/19/23 1241  TempSrc:   PainSc: 3                  Reed Breech

## 2023-01-19 NOTE — Op Note (Unsigned)
Maureen Gutierrez, HAJI MEDICAL RECORD NO: [id number] ACCOUNT NO: [id number] DATE OF BIRTH: 30-May-2000 FACILITY: [facility] LOCATION: [facility] PHYSICIAN: Suzy Bouchard, MD  Operative Report   DATE OF PROCEDURE: 01/18/2023  PREOPERATIVE DIAGNOSES:  Active phase arrest, prolonged rupture of membranes without cervical dilation past 4 cm.  POSTOPERATIVE DIAGNOSES:  1.  Active phase arrest, prolonged rupture of membranes without cervical dilation past 4 cm. 2.  Vigorous female, delivered.  PROCEDURE:  Primary low transverse cesarean section.  ANESTHESIA:  Spinal.  SURGEON:  Suzy Bouchard, MD  FIRST ASSISTANT:  Certified nurse midwife, Chryl Heck.  INDICATIONS:  A 22 year old gravida 1, para 0 presented the day before the procedure with spontaneous rupture of membranes.  The patient labored for 20 hours and did not move past 4 cm dilation despite intrauterine pressure catheter and IV Pitocin.  DESCRIPTION OF PROCEDURE:  After adequate spinal anesthesia, the patient was placed in dorsal supine position, hip roll on the right side.  The patient's abdomen and vagina were prepped and draped in normal sterile fashion.  Timeout was performed.  The  patient did receive 3 grams IV Ancef and 500 mg azithromycin for surgical prophylaxis.  Pfannenstiel incision was made 2 fingerbreadths above the symphysis pubis.  Sharp dissection was used to identify the fascia.  Fascia was opened in the midline and  opened in a transverse fashion.  Superior aspect of the fascia was grasped with Kocher clamps and the recti muscles were dissected free.  Inferior aspect of fascia was grasped with Kocher clamps and pyramidalis muscle was dissected free.  Entry into the  peritoneal cavity was accomplished sharply.  Vesicouterine peritoneal fold was identified and opened and bladder was reflected inferiorly.  Low transverse uterine incision was made.  Upon entry into the endometrial cavity, clear fluid resulted.   Fetal  head was asynclitic and caput was noted. Head was elevated to the incision given the size of the head. A vacuum was applied at the central flexion portion of the head.  One pop-off occurred and on the third application of the vacuum, the large head was  delivered. Vacuum was removed and shoulders were then delivered and body without difficulty.  Large vigorous female was then dried on the mother's abdomen for 60 seconds.  Cord was doubly clamped and vigorous female was passed to nursery staff who assigned  Apgar scores of 8 and 9, fetal weight 3960 grams.  Placenta was manually delivered.  Uterus was exteriorized and the endometrial cavity was wiped clean with laparotomy tape.  Uterine incision was closed with 1 chromic suture in a running locking fashion.   Two additional figure-of-eight sutures required for hemostasis.  Intramuscular 0.2 mg of Methergine was administered for mild uterine atony.  The posterior cul-de-sac was irrigated and suctioned and the uterus was placed back into the abdominal cavity.   Pericolic gutters were wiped clean with laparotomy tape.  Uterine incision again appeared hemostatic.  Interceed was placed over the uterine incision in T-shape fashion.  The fascia was then closed with 0 Vicryl suture in a running nonlocking fashion,  two separate sutures used.  Fascial edges were injected with a solution of 60 mL 0.25% Marcaine plus 20 mL normal saline.  Approximately 30 mL of the solution was utilized.  Subcutaneous tissues were irrigated and bovied and given the depth of the  subcutaneous tissues of 4 cm, the dead space was closed with a running 2-0 chromic suture.  Skin was reapproximated with Insorb absorbable staples and additional 30  mL of Marcaine solution was injected beneath the skin.  There were no complications.  ESTIMATED BLOOD LOSS/QUANTITATIVE BLOOD LOSS:  570 mL  INTRAOPERATIVE FLUIDS:  900 mL  URINE OUTPUT:  100 mL  The patient tolerated the procedure well,  was taken to recovery room in good condition.  The patient did receive 30 mg intravenous Toradol at the end of the procedure.       PAA D: 01/18/2023 6:42:05 pm T: 01/18/2023 10:59:00 pm  JOB: [id number]/ [id number]

## 2023-01-19 NOTE — Progress Notes (Signed)
Obstetric Postpartum/PostOperative Daily Progress Note Subjective:  22 y.o. G1P1001 post-operative day # 1 status post primary cesarean delivery.  She is ambulating, is tolerating po, is voiding spontaneously.  Her pain is well controlled on PO pain medications. Her lochia is less than menses.   Medications SCHEDULED MEDICATIONS   acetaminophen  1,000 mg Oral Q6H   enoxaparin (LOVENOX) injection  40 mg Subcutaneous Q24H   ferrous sulfate  325 mg Oral Q breakfast   gabapentin  300 mg Oral QHS   ketorolac  30 mg Intravenous Q6H   Followed by   ibuprofen  600 mg Oral Q6H   lidocaine  1 patch Transdermal Q24H   prenatal multivitamin  1 tablet Oral Q1200   scopolamine  1 patch Transdermal Once   senna-docusate  2 tablet Oral Daily   simethicone  80 mg Oral TID PC    MEDICATION INFUSIONS   naloxone HCl (NARCAN) 2 mg in dextrose 5 % 250 mL infusion      PRN MEDICATIONS  calcium carbonate, coconut oil, witch hazel-glycerin **AND** dibucaine, diphenhydrAMINE, ketorolac **OR** ketorolac, menthol-cetylpyridinium, morphine injection, naloxone **AND** sodium chloride flush, naloxone HCl (NARCAN) 2 mg in dextrose 5 % 250 mL infusion, ondansetron (ZOFRAN) IV, oxyCODONE, simethicone, zolpidem    Objective:   Vitals:   01/19/23 0019 01/19/23 0402 01/19/23 0802 01/19/23 1151  BP: 128/60 (!) 119/57 103/62 (!) 99/52  Pulse: 97 87 90 75  Resp: 17 18 16 16   Temp: 98 F (36.7 C) 98.3 F (36.8 C) 98.3 F (36.8 C) 97.7 F (36.5 C)  TempSrc: Oral Oral Oral Oral  SpO2: 97% 98% 97% 100%  Weight:      Height:        Current Vital Signs 24h Vital Sign Ranges  T 97.7 F (36.5 C) Temp  Avg: 98.3 F (36.8 C)  Min: 97.7 F (36.5 C)  Max: 98.8 F (37.1 C)  BP (!) 99/52 BP  Min: 99/52  Max: 147/84  HR 75 Pulse  Avg: 87.7  Min: 75  Max: 97  RR 16 Resp  Avg: 17  Min: 14  Max: 20  SaO2 100 % Room Air SpO2  Avg: 98.3 %  Min: 95 %  Max: 100 %       24 Hour I/O Current Shift I/O  Time Ins Outs 10/19  0701 - 10/20 0700 In: 1027.2 [I.V.:500] Out: 2270 [Urine:1700] 10/20 0701 - 10/20 1900 In: -  Out: 250 [Urine:250]  General: NAD Pulmonary: no increased work of breathing Abdomen: non-distended, non-tender, fundus firm at level of umbilicus Inc: Clean/dry/intact, pressure dressing remains in place Extremities: no edema, no erythema, no tenderness  Labs:  Recent Labs  Lab 01/17/23 2243 01/19/23 0627  WBC 15.3* 14.7*  HGB 11.1* 8.8*  HCT 33.0* 26.6*  PLT 301 236     Assessment:   22 y.o. G1P1001 postoperative day # 1 status post primary cesarean section  Plan:  1) Acute blood loss anemia - hemodynamically stable and asymptomatic - po ferrous sulfate  2) A POS / Rubella 1.26 (03/25 1439)/ Varicella Immune  3) TDAP status received prenatally 11/29/2022  4) bottle /Contraception =  undecided  5) Disposition: anticipate discharge home on POD#2  Dominica Severin, CNM

## 2023-01-20 ENCOUNTER — Encounter: Payer: Self-pay | Admitting: Licensed Practical Nurse

## 2023-01-20 ENCOUNTER — Encounter: Payer: Self-pay | Admitting: Obstetrics and Gynecology

## 2023-01-20 MED ORDER — IBUPROFEN 600 MG PO TABS: 600.0000 mg | ORAL_TABLET | Freq: Four times a day (QID) | ORAL | 0 refills | Status: DC

## 2023-01-20 MED ORDER — OXYCODONE HCL 5 MG PO TABS: 5.0000 mg | ORAL_TABLET | ORAL | 0 refills | Status: DC | PRN

## 2023-01-20 MED ORDER — ACETAMINOPHEN 500 MG PO TABS: 1000.0000 mg | ORAL_TABLET | Freq: Four times a day (QID) | ORAL | 0 refills | Status: DC

## 2023-01-20 MED ORDER — NORETHINDRONE 0.35 MG PO TABS: 1.0000 | ORAL_TABLET | Freq: Every day | ORAL | 11 refills | Status: DC

## 2023-01-20 NOTE — Progress Notes (Signed)
Patient discharged home with infant. Discharge instructions and prescriptions given and reviewed with patient. Patient verbalized understanding. Escorted out by auxillary.  

## 2023-01-21 LAB — CERVICOVAGINAL ANCILLARY ONLY
Chlamydia: NEGATIVE
Comment: NEGATIVE
Comment: NORMAL
Neisseria Gonorrhea: NEGATIVE

## 2023-01-30 ENCOUNTER — Ambulatory Visit: Payer: BC Managed Care – PPO | Admitting: Obstetrics and Gynecology

## 2023-01-30 ENCOUNTER — Encounter: Payer: Self-pay | Admitting: Obstetrics and Gynecology

## 2023-01-30 VITALS — BP 109/72 | HR 76 | Ht 69.0 in | Wt 239.8 lb

## 2023-01-30 DIAGNOSIS — Z9889 Other specified postprocedural states: Secondary | ICD-10-CM

## 2023-01-30 NOTE — Progress Notes (Signed)
Patient presents today for 2 week postpartum follow-up. Patient had a cesarean delivery on 01/18/23. Baby is bottle feeding. She states she has started Micronor for birth control. EPDS score of  1. She states no other questions or concerns at this time.

## 2023-01-30 NOTE — Progress Notes (Signed)
HPI:      Maureen Gutierrez is a 22 y.o. G1P1001 who LMP was No LMP recorded.  Subjective:   She presents today 2 weeks from cesarean delivery.  She reports she is doing well.  Normal bowel movements urinating without problem.  She is bottlefeeding.  Is already taking Micronor.  Reports a small amount of bleeding but not significant.    Hx: The following portions of the patient's history were reviewed and updated as appropriate:             She  has a past medical history of Anxiety, Eczema, Heavy periods, Menorrhagia with regular cycle (02/16/2018), Ovarian cyst, and Painful menstrual periods. She does not have any pertinent problems on file. She  has a past surgical history that includes Tonsillectomy and adenoidectomy and Cesarean section (01/18/2023). Her family history includes ADD / ADHD in her brother; Diabetes in her father; Hypertension in her father; Obesity in her mother; Rheum arthritis in her maternal grandmother. She  reports that she has quit smoking. Her smoking use included cigarettes. She has quit using smokeless tobacco. She reports that she does not currently use alcohol. She reports that she does not use drugs. She has a current medication list which includes the following prescription(s): acetaminophen, ferrous sulfate, ibuprofen, norethindrone, prenatal multivitamin, sertraline, and oxycodone. She has No Known Allergies.       Review of Systems:  Review of Systems  Constitutional: Denied constitutional symptoms, night sweats, recent illness, fatigue, fever, insomnia and weight loss.  Eyes: Denied eye symptoms, eye pain, photophobia, vision change and visual disturbance.  Ears/Nose/Throat/Neck: Denied ear, nose, throat or neck symptoms, hearing loss, nasal discharge, sinus congestion and sore throat.  Cardiovascular: Denied cardiovascular symptoms, arrhythmia, chest pain/pressure, edema, exercise intolerance, orthopnea and palpitations.  Respiratory: Denied pulmonary  symptoms, asthma, pleuritic pain, productive sputum, cough, dyspnea and wheezing.  Gastrointestinal: Denied, gastro-esophageal reflux, melena, nausea and vomiting.  Genitourinary: Denied genitourinary symptoms including symptomatic vaginal discharge, pelvic relaxation issues, and urinary complaints.  Musculoskeletal: Denied musculoskeletal symptoms, stiffness, swelling, muscle weakness and myalgia.  Dermatologic: Denied dermatology symptoms, rash and scar.  Neurologic: Denied neurology symptoms, dizziness, headache, neck pain and syncope.  Psychiatric: Denied psychiatric symptoms, anxiety and depression.  Endocrine: Denied endocrine symptoms including hot flashes and night sweats.   Meds:   Current Outpatient Medications on File Prior to Visit  Medication Sig Dispense Refill   acetaminophen (TYLENOL) 500 MG tablet Take 2 tablets (1,000 mg total) by mouth every 6 (six) hours. 30 tablet 0   ferrous sulfate 325 (65 FE) MG EC tablet Take 325 mg by mouth 3 (three) times daily with meals.     ibuprofen (ADVIL) 600 MG tablet Take 1 tablet (600 mg total) by mouth every 6 (six) hours. 30 tablet 0   norethindrone (ORTHO MICRONOR) 0.35 MG tablet Take 1 tablet (0.35 mg total) by mouth daily. 28 tablet 11   Prenatal Vit-Fe Fumarate-FA (PRENATAL MULTIVITAMIN) TABS tablet Take 1 tablet by mouth daily at 12 noon.     sertraline (ZOLOFT) 50 MG tablet Take 1 tablet (50 mg total) by mouth daily. 30 tablet 3   oxyCODONE (OXY IR/ROXICODONE) 5 MG immediate release tablet Take 1-2 tablets (5-10 mg total) by mouth every 4 (four) hours as needed for moderate pain (pain score 4-6). (Patient not taking: Reported on 01/30/2023) 30 tablet 0   No current facility-administered medications on file prior to visit.      Objective:     Vitals:  01/30/23 1040  BP: 109/72  Pulse: 76   Filed Weights   01/30/23 1040  Weight: 239 lb 12.8 oz (108.8 kg)               Abdomen: Soft.  Non-tender.  No masses.  No HSM.   Incision/s: Intact.  Healing well.  No erythema.  No drainage.             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 Postoperative state   2. Postpartum care following cesarean delivery     Doing well postop and postpartum   Plan:            1.  May resume normal activities with exception of heavy lifting.  Follow-up in 4 weeks. Orders No orders of the defined types were placed in this encounter.   No orders of the defined types were placed in this encounter.     F/U  Return in about 4 weeks (around 02/27/2023).  Elonda Husky, M.D. 01/30/2023 10:49 AM

## 2023-01-31 DIAGNOSIS — Z419 Encounter for procedure for purposes other than remedying health state, unspecified: Secondary | ICD-10-CM | POA: Diagnosis not present

## 2023-02-26 ENCOUNTER — Ambulatory Visit: Payer: BC Managed Care – PPO | Admitting: Licensed Practical Nurse

## 2023-02-26 ENCOUNTER — Ambulatory Visit (INDEPENDENT_AMBULATORY_CARE_PROVIDER_SITE_OTHER): Payer: BC Managed Care – PPO | Admitting: Licensed Practical Nurse

## 2023-02-26 DIAGNOSIS — Z862 Personal history of diseases of the blood and blood-forming organs and certain disorders involving the immune mechanism: Secondary | ICD-10-CM

## 2023-02-26 DIAGNOSIS — Z1332 Encounter for screening for maternal depression: Secondary | ICD-10-CM | POA: Diagnosis not present

## 2023-02-26 DIAGNOSIS — N852 Hypertrophy of uterus: Secondary | ICD-10-CM

## 2023-02-26 NOTE — Progress Notes (Signed)
Postpartum Visit  Chief Complaint:  Chief Complaint  Patient presents with   Postpartum Care    6 week postpartum     History of Present Illness: Patient is a 22 y.o. G1P1001 presents for postpartum visit.  Date of delivery: 01/18/2023 Type of delivery: PLTCS arrest of active phase Episiotomy No.  Laceration:LTCS Pregnancy or labor problems:  elevated BMI, chlamydia in  pregnancy  Any problems since the delivery:   -Bleeding: has a large gush of blood once a day, notices towards the end of the day after being on her feet -Sleep is good -Appetite is normal -constipation: has increased fiber and always drinking fluids has not tried colace -Had IC it was a little uncomfortable, unsure about future pregnancies, has already started POP  -Returned to work at 4 weeks, has increased in bleeding when she returns home. Works at [employer] 3-4 days per week, 7 hour shifts -Mood has been good. Zoloft helps  -Lives with partner, lots of family nearby   Newborn Details:  SINGLETON :  1. Baby's name: female. Birth weight: 8lbs 11oz Maternal Details:  Breast Feeding:  no Post partum depression/anxiety noted:  no Edinburgh Post-Partum Depression Score:  0  Date of last PAP: 07/2022  LSIL   Past Medical History:  Diagnosis Date   Anxiety    Eczema    Heavy periods    Menorrhagia with regular cycle 02/16/2018   Ovarian cyst    left - [name]   Painful menstrual periods     Past Surgical History:  Procedure Laterality Date   CESAREAN SECTION  01/18/2023   Procedure: CESAREAN SECTION;  Surgeon: Suzy Bouchard, MD;  Location: [facility] ORS;  Service: Obstetrics;;   TONSILLECTOMY AND ADENOIDECTOMY      Prior to Admission medications   Medication Sig Start Date End Date Taking? Authorizing Provider  norethindrone (ORTHO MICRONOR) 0.35 MG tablet Take 1 tablet (0.35 mg total) by mouth daily. 01/20/23 01/20/24 Yes Raeford Razor, CNM  Prenatal Vit-Fe Fumarate-FA  (PRENATAL MULTIVITAMIN) TABS tablet Take 1 tablet by mouth daily at 12 noon.   Yes [provider]  sertraline (ZOLOFT) 50 MG tablet Take 1 tablet (50 mg total) by mouth daily. 12/20/22  Yes Eeshan Verbrugge, Courtney Heys, CNM  acetaminophen (TYLENOL) 500 MG tablet Take 2 tablets (1,000 mg total) by mouth every 6 (six) hours. 01/20/23   Raeford Razor, CNM  ferrous sulfate 325 (65 FE) MG EC tablet Take 325 mg by mouth 3 (three) times daily with meals.    [provider]  ibuprofen (ADVIL) 600 MG tablet Take 1 tablet (600 mg total) by mouth every 6 (six) hours. 01/20/23   Raeford Razor, CNM  oxyCODONE (OXY IR/ROXICODONE) 5 MG immediate release tablet Take 1-2 tablets (5-10 mg total) by mouth every 4 (four) hours as needed for moderate pain (pain score 4-6). Patient not taking: Reported on 01/30/2023 01/20/23   Raeford Razor, CNM    No Known Allergies   Social History   Socioeconomic History   Marital status: Significant Other    Spouse name: DOES NOT WANT TO GIVE NAME   Number of children: 0   Years of education: Not on file   Highest education level: High school graduate  Occupational History   Occupation: Server    Comment: [employer]   Occupation: Conservation officer, nature    Comment: [employer]   Occupation: Hosts    Comment: [employer]   Occupation: NOT CURRENTLY EMPLOYEED  Tobacco Use  Smoking status: Former    Current packs/day: 0.50    Types: Cigarettes   Smokeless tobacco: Former  Building services engineer status: Former   Start date: 01/30/2018   Substances: Flavoring  Substance and Sexual Activity   Alcohol use: Not Currently    Comment: rare   Drug use: No   Sexual activity: Not Currently    Partners: Male    Birth control/protection: Pill  Other Topics Concern   Not on file  Social History Narrative   Not on file   Social Determinants of Health   Financial Resource Strain: Low Risk  (05/06/2022)   Overall Financial Resource Strain (CARDIA)    Difficulty of Paying Living  Expenses: Not hard at all  Food Insecurity: No Food Insecurity (01/17/2023)   Hunger Vital Sign    Worried About Running Out of Food in the Last Year: Never true    Ran Out of Food in the Last Year: Never true  Transportation Needs: No Transportation Needs (01/17/2023)   PRAPARE - Administrator, Civil Service (Medical): No    Lack of Transportation (Non-Medical): No  Physical Activity: Insufficiently Active (05/06/2022)   Exercise Vital Sign    Days of Exercise per Week: 1 day    Minutes of Exercise per Session: 10 min  Stress: No Stress Concern Present (05/06/2022)   [employer] of Occupational Health - Occupational Stress Questionnaire    Feeling of Stress : Only a little  Social Connections: Socially Isolated (05/06/2022)   Social Connection and Isolation Panel [NHANES]    Frequency of Communication with Friends and Family: Twice a week    Frequency of Social Gatherings with Friends and Family: Three times a week    Attends Religious Services: Never    Active Member of Clubs or Organizations: No    Attends Banker Meetings: Never    Marital Status: Never married  Intimate Partner Violence: Not At Risk (01/17/2023)   Humiliation, Afraid, Rape, and Kick questionnaire    Fear of Current or Ex-Partner: No    Emotionally Abused: No    Physically Abused: No    Sexually Abused: No    Family History  Problem Relation Age of Onset   Obesity Mother    Diabetes Father    Hypertension Father    ADD / ADHD Brother    Rheum arthritis Maternal Grandmother    Breast cancer Neg Hx    Ovarian cancer Neg Hx    Colon cancer Neg Hx     ROS see  HPI   Physical Exam BP 133/84   Pulse 95   Wt 236 lb (107 kg)   Breastfeeding No   BMI 34.85 kg/m   Physical Exam Constitutional:      Appearance: Normal appearance.  Genitourinary:     Vulva normal.     Genitourinary Comments: Bimanual exam: uterus 10wk sized, no masses, adnexa slightly tender on LLQ, no masses  not enlarged. Some tone.   Cardiovascular:     Rate and Rhythm: Normal rate and regular rhythm.  Pulmonary:     Effort: Pulmonary effort is normal.     Breath sounds: Normal breath sounds.  Abdominal:     Comments: C/S incision healed, some tenderness just above incision.   Musculoskeletal:        General: Normal range of motion.     Cervical back: Normal range of motion and neck supple.  Neurological:     General: No focal deficit  present.     Mental Status: She is alert.  Skin:    General: Skin is warm.  Psychiatric:        Mood and Affect: Mood normal.      Female Chaperone present during breast and/or pelvic exam.  Assessment: 22 y.o. G1P1001 presenting for 6 week postpartum visit  Plan: Problem List Items Addressed This Visit   None Visit Diagnoses     Postpartum exam    -  Primary   Relevant Orders   CBC   US PELVIS TRANSVAGINAL NON-OB (TV ONLY)   Encounter for screening for maternal depression       History of anemia       Relevant Orders   CBC   Enlarged uterus       Relevant Orders   US PELVIS TRANSVAGINAL NON-OB (TV ONLY)        1) Contraception Education given regarding options for contraception, including oral contraceptives.  2)  Pap - ASCCP guidelines and rational discussed. Reviewed results with Dr Valentino Saxon, D/t pt's age, she does not need a colpo at this time.  Needs repeat pap  April 2025   3) Patient underwent screening for postpartum depression with NO concerns noted.  4) Follow up April for repeat pap   5) Discussed it is possible she is doing too much too fast, recommend not working for 2 weeks to see if her bleeding resolves. Pelvic US ordered. Letter for work given.   Carie Caddy, CNM  ,[facility]  02/26/23  4:50 PM

## 2023-02-27 LAB — CBC
Hematocrit: 36.1 % (ref 34.0–46.6)
Hemoglobin: 11.5 g/dL (ref 11.1–15.9)
MCH: 28.5 pg (ref 26.6–33.0)
MCHC: 31.9 g/dL (ref 31.5–35.7)
MCV: 89 fL (ref 79–97)
Platelets: 381 10*3/uL (ref 150–450)
RBC: 4.04 x10E6/uL (ref 3.77–5.28)
RDW: 12.6 % (ref 11.7–15.4)
WBC: 13 10*3/uL — ABNORMAL HIGH (ref 3.4–10.8)

## 2023-03-02 DIAGNOSIS — Z419 Encounter for procedure for purposes other than remedying health state, unspecified: Secondary | ICD-10-CM | POA: Diagnosis not present

## 2023-03-04 ENCOUNTER — Inpatient Hospital Stay
Admission: RE | Admit: 2023-03-04 | Discharge: 2023-03-04 | Discharge status: Home / Self Care | Payer: Medicaid Other | Source: Ambulatory Visit | Attending: Licensed Practical Nurse | Admitting: Licensed Practical Nurse

## 2023-03-04 DIAGNOSIS — N852 Hypertrophy of uterus: Secondary | ICD-10-CM

## 2023-03-11 ENCOUNTER — Encounter: Payer: Self-pay | Admitting: Licensed Practical Nurse

## 2023-03-12 NOTE — Telephone Encounter (Signed)
Spoke with patient. Guided thru my chart to advise on how to obtain letter in PDF format. Patient states the last letter that is in her my chart is from 12/15/22. Advised will try to send as an attachment to her my chart message.

## 2023-04-02 DIAGNOSIS — Z419 Encounter for procedure for purposes other than remedying health state, unspecified: Secondary | ICD-10-CM | POA: Diagnosis not present

## 2023-04-15 ENCOUNTER — Encounter: Payer: Self-pay | Admitting: Licensed Practical Nurse

## 2023-04-16 ENCOUNTER — Telehealth: Payer: Self-pay

## 2023-04-16 NOTE — Telephone Encounter (Signed)
 Called Jacci to find out a little bit more on what's going on as far as the bleeding. She had a c-section, she's on birth control but stating she's back bleeding, but not changing a pad every 2 hours no cramps or pain. Patient states that every since birth she's not her self, doesn't want to leave the apartment. Spoke with Lonzell Robin she advised that Dollene should come in the office to see any provider as this maybe apart of some depression and get checked out on the bleeding and have a CBC done. Patient understood and was trasferred to make an appt

## 2023-04-17 ENCOUNTER — Ambulatory Visit (INDEPENDENT_AMBULATORY_CARE_PROVIDER_SITE_OTHER): Payer: Medicaid Other | Admitting: Licensed Practical Nurse

## 2023-04-17 ENCOUNTER — Encounter: Payer: Self-pay | Admitting: Licensed Practical Nurse

## 2023-04-17 VITALS — BP 112/79 | HR 97 | Ht 68.0 in | Wt 254.7 lb

## 2023-04-17 DIAGNOSIS — F320 Major depressive disorder, single episode, mild: Secondary | ICD-10-CM

## 2023-04-17 DIAGNOSIS — N939 Abnormal uterine and vaginal bleeding, unspecified: Secondary | ICD-10-CM | POA: Diagnosis not present

## 2023-04-17 DIAGNOSIS — R6889 Other general symptoms and signs: Secondary | ICD-10-CM

## 2023-04-17 DIAGNOSIS — Z30018 Encounter for initial prescription of other contraceptives: Secondary | ICD-10-CM

## 2023-04-17 DIAGNOSIS — R5383 Other fatigue: Secondary | ICD-10-CM | POA: Diagnosis not present

## 2023-04-17 MED ORDER — SLYND 4 MG PO TABS: 1.0000 | ORAL_TABLET | Freq: Every day | ORAL | Status: DC

## 2023-04-17 MED ORDER — SERTRALINE HCL 25 MG PO TABS: 25.0000 mg | ORAL_TABLET | Freq: Every day | ORAL | 3 refills | Status: DC

## 2023-04-17 NOTE — Progress Notes (Signed)
Alba Cory, MD  CC: Postpartum depression and abnormal uterine bleeding   HPI:      Maureen Gutierrez is a 23 y.o. G1P1001 whose LMP was Patient's last menstrual period was 04/04/2023 (exact date)., presents today for abnormal uterine bleeding and depressed mood. She reports experiencing continued uterine bleeding after having a cesarean birth in October. The bleeding was present when she came for her 6 week postpartum follow up. She took some time off work, as she thought going back to work soon after birth was affecting her bleeding (she is working at [employer]), noticed decrease in bleeding, returned to work, and the bleeding returned. She has had about a 10 day break in bleeding from around January 4-13, and she returned to work on the 13th only to start bleeding again. She reports changing pads 3-4x during her shifts at work, although today she reports she is only noticing a small amount of blood when she wipes. She reports this bleeding is negatively affecting her mental health, and feels sometimes like she does not want to leave the house. She also reports around a 20 lb weight gain since the end of November, fatigue, increased hair loss, and feeling colder than usual.   She reports intercourse with her partner once since her cesarean, just prior to her 6 week postpartum follow up. She reports she is taking her birth control pills at the same time every day, in the evenings. She denies severe cramping with the bleeding.     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   Panic disorder 09/05/2020   Tobacco dependence 07/26/2019   Hidradenitis suppurativa 01/21/2019   Keratosis pilaris 02/16/2018   Left ovarian cyst 01/18/2017    Past Surgical History:  Procedure  Laterality Date   CESAREAN SECTION  01/18/2023   Procedure: CESAREAN SECTION;  Surgeon: Schermerhorn, Ihor Austin, MD;  Location: [facility] ORS;  Service: Obstetrics;;   TONSILLECTOMY AND ADENOIDECTOMY      Family History  Problem Relation Age of Onset   Obesity Mother    Diabetes Father    Hypertension Father    ADD / ADHD Brother    Rheum arthritis Maternal Grandmother    Breast cancer Neg Hx    Ovarian cancer Neg Hx    Colon cancer Neg Hx     Social History   Socioeconomic History   Marital status: Significant Other    Spouse name: DOES NOT WANT TO GIVE NAME   Number of children: 0   Years of education: Not on file   Highest education level: High school graduate  Occupational History   Occupation: Server    Comment: [employer]   Occupation: Conservation officer, nature    Comment: [employer]   Occupation: Hosts    Comment: [employer]   Occupation: NOT CURRENTLY EMPLOYEED  Tobacco Use   Smoking status: Former    Current packs/day: 0.50    Types: Cigarettes   Smokeless tobacco: Former  Advertising account planner   Vaping status: Former   Start date: 01/30/2018   Substances: Flavoring  Substance and Sexual Activity   Alcohol use: Not Currently    Comment: rare   Drug use: No   Sexual activity: Not Currently    Partners: Male    Birth  control/protection: Pill  Other Topics Concern   Not on file  Social History Narrative   Not on file   Social Drivers of Health   Financial Resource Strain: Low Risk  (05/06/2022)   Overall Financial Resource Strain (CARDIA)    Difficulty of Paying Living Expenses: Not hard at all  Food Insecurity: No Food Insecurity (01/17/2023)   Hunger Vital Sign    Worried About Running Out of Food in the Last Year: Never true    Ran Out of Food in the Last Year: Never true  Transportation Needs: No Transportation Needs (01/17/2023)   PRAPARE - Administrator, Civil Service (Medical): No    Lack of Transportation (Non-Medical): No  Physical Activity: Insufficiently  Active (05/06/2022)   Exercise Vital Sign    Days of Exercise per Week: 1 day    Minutes of Exercise per Session: 10 min  Stress: No Stress Concern Present (05/06/2022)   [employer] of Occupational Health - Occupational Stress Questionnaire    Feeling of Stress : Only a little  Social Connections: Socially Isolated (05/06/2022)   Social Connection and Isolation Panel [NHANES]    Frequency of Communication with Friends and Family: Twice a week    Frequency of Social Gatherings with Friends and Family: Three times a week    Attends Religious Services: Never    Active Member of Clubs or Organizations: No    Attends Banker Meetings: Never    Marital Status: Never married  Intimate Partner Violence: Not At Risk (01/17/2023)   Humiliation, Afraid, Rape, and Kick questionnaire    Fear of Current or Ex-Partner: No    Emotionally Abused: No    Physically Abused: No    Sexually Abused: No    Outpatient Medications Prior to Visit  Medication Sig Dispense Refill   sertraline (ZOLOFT) 50 MG tablet Take 1 tablet (50 mg total) by mouth daily. 30 tablet 3   norethindrone (ORTHO MICRONOR) 0.35 MG tablet Take 1 tablet (0.35 mg total) by mouth daily. 28 tablet 11   Prenatal Vit-Fe Fumarate-FA (PRENATAL MULTIVITAMIN) TABS tablet Take 1 tablet by mouth daily at 12 noon.     No facility-administered medications prior to visit.    ROS:  Review of Systems  Constitutional:  Positive for fatigue and unexpected weight change.  Genitourinary:  Positive for vaginal bleeding.  Psychiatric/Behavioral:  The patient is nervous/anxious.   All other systems reviewed and are negative.  OBJECTIVE:   Vitals:  BP 112/79   Pulse 97   Ht 5\' 8"  (1.727 m)   Wt 254 lb 11.2 oz (115.5 kg)   LMP 04/04/2023 (Exact Date)   Breastfeeding No   BMI 38.73 kg/m   Physical Exam Constitutional:      Appearance: Normal appearance.  Cardiovascular:     Rate and Rhythm: Normal rate.  Pulmonary:      Effort: Pulmonary effort is normal.  Genitourinary:    Comments: Deferred since normal pelvic US  Skin:    General: Skin is warm and dry.  Neurological:     Mental Status: She is alert.    Results: Labs pending    Assessment/Plan: Current mild episode of major depressive disorder, unspecified whether recurrent (HCC) - Plan: sertraline (ZOLOFT) 25 MG tablet  Cold intolerance - Plan: TSH + free T4, CBC w/Diff/Platelet  Fatigue, unspecified type - Plan: TSH + free T4, CBC w/Diff/Platelet  Encounter for initial prescription of other contraceptives - Plan: Drospirenone (SLYND) 4 MG TABS  Abnormal vaginal bleeding- will order CBC with diff/plts and thyroid panel. Discussed this could be multifactorial; offered a different POP to try. She would like to try Slynd to see if bleeding decreases. RTC in 3 months to re-evaluate.   Panic disorder/depressed mood- will increase Zoloft to 75mg . Will re-evaluate dose when she returns in 3 months and adjust if needed.   Meds ordered this encounter  Medications   Drospirenone (SLYND) 4 MG TABS    Sig: Take 1 tablet (4 mg total) by mouth daily.   sertraline (ZOLOFT) 25 MG tablet    Sig: Take 1 tablet (25 mg total) by mouth daily.    Dispense:  30 tablet    Refill:  3     LYDIA M DOMINIC, CNM 04/17/2023 5:30 PM  R [facility] present for visit and exam   Carie Caddy, CNM present for all portions of care.  04/17/2023  5:11 PM

## 2023-04-18 LAB — CBC WITH DIFFERENTIAL/PLATELET
Basophils Absolute: 0.1 10*3/uL (ref 0.0–0.2)
Basos: 1 %
EOS (ABSOLUTE): 0.3 10*3/uL (ref 0.0–0.4)
Eos: 2 %
Hematocrit: 35.5 % (ref 34.0–46.6)
Hemoglobin: 11.3 g/dL (ref 11.1–15.9)
Immature Grans (Abs): 0 10*3/uL (ref 0.0–0.1)
Immature Granulocytes: 0 %
Lymphocytes Absolute: 2.9 10*3/uL (ref 0.7–3.1)
Lymphs: 22 %
MCH: 26.8 pg (ref 26.6–33.0)
MCHC: 31.8 g/dL (ref 31.5–35.7)
MCV: 84 fL (ref 79–97)
Monocytes Absolute: 0.7 10*3/uL (ref 0.1–0.9)
Monocytes: 5 %
Neutrophils Absolute: 9.1 10*3/uL — ABNORMAL HIGH (ref 1.4–7.0)
Neutrophils: 70 %
Platelets: 406 10*3/uL (ref 150–450)
RBC: 4.22 x10E6/uL (ref 3.77–5.28)
RDW: 12.9 % (ref 11.7–15.4)
WBC: 13.1 10*3/uL — ABNORMAL HIGH (ref 3.4–10.8)

## 2023-04-18 LAB — TSH+FREE T4
Free T4: 1.06 ng/dL (ref 0.82–1.77)
TSH: 1.69 u[IU]/mL (ref 0.450–4.500)

## 2023-05-01 ENCOUNTER — Encounter: Payer: Self-pay | Admitting: Licensed Practical Nurse

## 2023-05-01 ENCOUNTER — Ambulatory Visit (INDEPENDENT_AMBULATORY_CARE_PROVIDER_SITE_OTHER): Payer: Medicaid Other | Admitting: Family Medicine

## 2023-05-01 ENCOUNTER — Encounter: Payer: Self-pay | Admitting: Family Medicine

## 2023-05-01 VITALS — BP 124/78 | HR 93 | Temp 98.2°F | Resp 16 | Ht 68.0 in | Wt 257.7 lb

## 2023-05-01 DIAGNOSIS — Z0000 Encounter for general adult medical examination without abnormal findings: Secondary | ICD-10-CM | POA: Diagnosis not present

## 2023-05-01 NOTE — Progress Notes (Signed)
Name: Maureen Gutierrez   MRN: [id number]    DOB: [birth year]   Date:05/01/2023       Progress Note  Subjective  Chief Complaint  Chief Complaint  Patient presents with   Annual Exam    HPI  Patient presents for annual CPE.  Diet: she states eating more protein, vegetables and fruit , had a baby 10 /2024 and during pregnancy had nausea  Exercise: not currently doing much outdoors, advised to take baby for a walk  Last Eye Exam:  discussed with patient  Last Dental Exam: completed  Flowsheet Row Office Visit from 05/01/2023 in [facility]  AUDIT-C Score 0      Depression: Phq 9 is  positive    05/01/2023   10:07 AM 07/18/2022    8:55 AM 06/07/2022    8:30 AM 05/06/2022    9:51 AM 03/19/2022    3:34 PM  Depression screen PHQ 2/9  Decreased Interest 1 0 0 0 0  Down, Depressed, Hopeless 1 0 0 0 0  PHQ - 2 Score 2 0 0 0 0  Altered sleeping 0 0  0 0  Tired, decreased energy 0 0  0 0  Change in appetite 0 0  0 0  Feeling bad or failure about yourself  0 0  0 0  Trouble concentrating 1 0  0 0  Moving slowly or fidgety/restless 0 0  0 0  Suicidal thoughts 0 0  0 0  PHQ-9 Score 3 0  0 0  Difficult doing work/chores Somewhat difficult Not difficult at all   Not difficult at all   Hypertension: BP Readings from Last 3 Encounters:  05/01/23 124/78  04/17/23 112/79  02/26/23 133/84   Obesity: Wt Readings from Last 3 Encounters:  05/01/23 257 lb 11.2 oz (116.9 kg)  04/17/23 254 lb 11.2 oz (115.5 kg)  02/26/23 236 lb (107 kg)   BMI Readings from Last 3 Encounters:  05/01/23 39.18 kg/m  04/17/23 38.73 kg/m  02/26/23 34.85 kg/m     Vaccines: reviewed with the patient.   Hep C Screening: completed STD testing and prevention (HIV/chl/gon/syphilis): UP to date  Intimate partner violence: negative screen  Sexual History : one partner Menstrual History/LMP/Abnormal Bleeding: LMP 04/04/2023, usually heavy cycles but not painful, started on  progesterone only ocp recently  Discussed importance of follow up if any post-menopausal bleeding: not applicable  Incontinence Symptoms: negative for symptoms   Breast cancer:  - Last Mammogram: N/A - BRCA gene screening: N/A only great-grandmother maternal side had ovarian cancer   Osteoporosis Prevention : Discussed high calcium and vitamin D supplementation, weight bearing exercises Bone density :not applicable   Cervical cancer screening:  she will have it done at gyn in April , last pap abnormal   Skin cancer: Discussed monitoring for atypical lesions  Colorectal cancer: N/A   Lung cancer:  Low Dose CT Chest recommended if Age 94-80 years, 20 pack-year currently smoking OR have quit w/in 15years. Patient does not qualify for screen   ECG: N/A  Advanced Care Planning: A voluntary discussion about advance care planning including the explanation and discussion of advance directives.  Discussed health care proxy and Living will, and the patient was able to identify a health care proxy as mother .  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   Panic disorder 09/05/2020   Tobacco dependence 07/26/2019   Hidradenitis suppurativa 01/21/2019   Keratosis pilaris 02/16/2018   Left ovarian cyst 01/18/2017    Past Surgical History:  Procedure Laterality Date   CESAREAN SECTION  01/18/2023   Procedure: CESAREAN SECTION;  Surgeon: Schermerhorn, Ihor Austin, MD;  Location: [facility] ORS;  Service: Obstetrics;;   TONSILLECTOMY AND ADENOIDECTOMY      Family History  Problem Relation Age of Onset   Obesity Mother    Anxiety disorder Mother    Diabetes Father    Hypertension Father    Depression Father     ADD / ADHD Brother    Rheum arthritis Maternal Grandmother    Arthritis Maternal Grandmother    Breast cancer Neg Hx    Ovarian cancer Neg Hx    Colon cancer Neg Hx     Social History   Socioeconomic History   Marital status: Significant Other    Spouse name: DOES NOT WANT TO GIVE NAME   Number of children: 0   Years of education: Not on file   Highest education level: GED or equivalent  Occupational History   Occupation: Production assistant, radio    Comment: [employer]   Occupation: Conservation officer, nature    Comment: [employer]   Occupation: Hosts    Comment: [employer]   Occupation: NOT CURRENTLY EMPLOYEED  Tobacco Use   Smoking status: Some Days    Current packs/day: 0.00    Types: E-cigarettes, Cigarettes    Last attempt to quit: 05/25/2022    Years since quitting: 0.9   Smokeless tobacco: Former  Advertising account planner   Vaping status: Former   Start date: 01/30/2018   Substances: Flavoring  Substance and Sexual Activity   Alcohol use: Not Currently    Comment: rare   Drug use: No   Sexual activity: Yes    Partners: Male    Birth control/protection: Pill, Other-see comments, None    Comment: I will be starting birth control pills when i start my cycle  Other Topics Concern   Not on file  Social History Narrative   Not on file   Social Drivers of Health   Financial Resource Strain: Low Risk  (04/29/2023)   Overall Financial Resource Strain (CARDIA)    Difficulty of Paying Living Expenses: Not hard at all  Food Insecurity: No Food Insecurity (04/29/2023)   Hunger Vital Sign    Worried About Running Out of Food in the Last Year: Never true    Ran Out of Food in the Last Year: Never true  Transportation Needs: No Transportation Needs (04/29/2023)   PRAPARE - Administrator, Civil Service (Medical): No    Lack of Transportation (Non-Medical): No  Physical Activity: Sufficiently Active (04/29/2023)   Exercise Vital Sign    Days of Exercise per Week: 7 days    Minutes of Exercise per Session:  30 min  Stress: No Stress Concern Present (04/29/2023)   [employer] of Occupational Health - Occupational Stress Questionnaire    Feeling of Stress : Not at all  Social Connections: Moderately Integrated (04/29/2023)   Social Connection and Isolation Panel [NHANES]    Frequency of Communication with Friends and Family: More than three times a week    Frequency of Social Gatherings with Friends and Family: More than three  times a week    Attends Religious Services: More than 4 times per year    Active Member of Clubs or Organizations: No    Attends Banker Meetings: Never    Marital Status: Living with partner  Intimate Partner Violence: Not At Risk (05/01/2023)   Humiliation, Afraid, Rape, and Kick questionnaire    Fear of Current or Ex-Partner: No    Emotionally Abused: No    Physically Abused: No    Sexually Abused: No     Current Outpatient Medications:    chlorhexidine (PERIDEX) 0.12 % solution, Use as directed 15 mLs in the mouth or throat daily., Disp: , Rfl:    Drospirenone (SLYND) 4 MG TABS, Take 1 tablet (4 mg total) by mouth daily., Disp: , Rfl:    sertraline (ZOLOFT) 25 MG tablet, Take 1 tablet (25 mg total) by mouth daily., Disp: 30 tablet, Rfl: 3   sertraline (ZOLOFT) 50 MG tablet, Take 1 tablet (50 mg total) by mouth daily., Disp: 30 tablet, Rfl: 3  No Known Allergies   ROS  Ten systems reviewed and is negative except as mentioned in HPI    Objective  Vitals:   05/01/23 1014  BP: 124/78  Pulse: 93  Resp: 16  Temp: 98.2 F (36.8 C)  TempSrc: Oral  SpO2: 96%  Weight: 257 lb 11.2 oz (116.9 kg)  Height: 5\' 8"  (1.727 m)    Body mass index is 39.18 kg/m.  Physical Exam  Constitutional: Patient appears well-developed and well-nourished. No distress.  HENT: Head: Normocephalic and atraumatic. Ears: B TMs ok, no erythema or effusion; Nose: Nose normal. Mouth/Throat: Oropharynx is clear and moist. No oropharyngeal exudate.  Eyes:  Conjunctivae and EOM are normal. Pupils are equal, round, and reactive to light. No scleral icterus.  Neck: Normal range of motion. Neck supple. No JVD present. No thyromegaly present.  Cardiovascular: Normal rate, regular rhythm and normal heart sounds.  No murmur heard. No BLE edema. Pulmonary/Chest: Effort normal and breath sounds normal. No respiratory distress. Abdominal: Soft. Bowel sounds are normal, no distension. There is no tenderness. no masses Breast: no lumps or masses, no nipple discharge or rashes FEMALE GENITALIA:  Not done  RECTAL: not done  Musculoskeletal: Normal range of motion, no joint effusions. No gross deformities Neurological: he is alert and oriented to person, place, and time. No cranial nerve deficit. Coordination, balance, strength, speech and gait are normal.  Skin: Skin is warm and dry. No rash noted. No erythema. Scar under left breast from HS Psychiatric: Patient has a normal mood and affect. behavior is normal. Judgment and thought content normal.     Assessment & Plan  1. Well adult exam (Primary)  She prefers not checking labs today    -USPSTF grade A and B recommendations reviewed with patient; age-appropriate recommendations, preventive care, screening tests, etc discussed and encouraged; healthy living encouraged; see AVS for patient education given to patient -Discussed importance of 150 minutes of physical activity weekly, eat two servings of fish weekly, eat one serving of tree nuts ( cashews, pistachios, pecans, almonds.Marland Kitchen) every other day, eat 6 servings of fruit/vegetables daily and drink plenty of water and avoid sweet beverages.   -Reviewed Health Maintenance: Yes.

## 2023-05-03 DIAGNOSIS — Z419 Encounter for procedure for purposes other than remedying health state, unspecified: Secondary | ICD-10-CM | POA: Diagnosis not present

## 2023-05-06 ENCOUNTER — Other Ambulatory Visit: Payer: Self-pay | Admitting: Licensed Practical Nurse

## 2023-05-06 DIAGNOSIS — Z3403 Encounter for supervision of normal first pregnancy, third trimester: Secondary | ICD-10-CM

## 2023-05-06 DIAGNOSIS — O99340 Other mental disorders complicating pregnancy, unspecified trimester: Secondary | ICD-10-CM

## 2023-05-08 ENCOUNTER — Ambulatory Visit (INDEPENDENT_AMBULATORY_CARE_PROVIDER_SITE_OTHER): Payer: BC Managed Care – PPO | Admitting: Family Medicine

## 2023-05-08 ENCOUNTER — Encounter: Payer: Self-pay | Admitting: Family Medicine

## 2023-05-08 VITALS — BP 112/70 | HR 98 | Temp 97.7°F | Resp 16 | Ht 68.0 in | Wt 258.0 lb

## 2023-05-08 DIAGNOSIS — F32A Depression, unspecified: Secondary | ICD-10-CM

## 2023-05-08 DIAGNOSIS — F419 Anxiety disorder, unspecified: Secondary | ICD-10-CM

## 2023-05-08 DIAGNOSIS — F410 Panic disorder [episodic paroxysmal anxiety] without agoraphobia: Secondary | ICD-10-CM

## 2023-05-08 DIAGNOSIS — L732 Hidradenitis suppurativa: Secondary | ICD-10-CM

## 2023-05-08 MED ORDER — BUSPIRONE HCL 5 MG PO TABS: 5.0000 mg | ORAL_TABLET | Freq: Two times a day (BID) | ORAL | 0 refills | Status: DC | PRN

## 2023-05-08 MED ORDER — DULOXETINE HCL 30 MG PO CPEP: 30.0000 mg | ORAL_CAPSULE | Freq: Every day | ORAL | 0 refills | Status: DC

## 2023-05-08 NOTE — Patient Instructions (Addendum)
 First week take Duloxetine  30 mg plus zoloft  50 mg Second week take 25 mg of zoloft  and take two caps of duloxetine  30 mg Buspar  one or two as needed for panic attack

## 2023-05-08 NOTE — Progress Notes (Signed)
 Name: Maureen Gutierrez   MRN: [id number]    DOB: 28-May-2000   Date:05/08/2023       Progress Note  Subjective  Chief Complaint  Chief Complaint  Patient presents with   Depression    Would like to try new med   Anxiety   Discussed the use of AI scribe software for clinical note transcription with the patient, who gave verbal consent to proceed.  History of Present Illness   Maureen Gutierrez is a 23 year old female with postpartum anxiety and depression who presents with worsening anxiety symptoms.  Since the postpartum period, she has experienced worsening anxiety symptoms, with significant increases in anxiety and panic attacks occurring approximately twice a week. During these episodes, she experiences crying, chest tightness, and feelings of tension and upset. She has been taking Zoloft  since seven months of pregnancy, initially at 50 mg, but it has not been effective. She is currently on  75 mg of Zoloft  daily. She has also used low-dose Xanax provided by her grandmother during panic attacks, which she found helpful, but it is not prescribed to her.  Following the birth of her child, she experienced prolonged bleeding for three months, exacerbating her depression and leading to a lack of motivation and energy. Her energy levels have slightly improved since her birth control was changed, and the bleeding has reduced, but she still feels her energy is not where she wants it to be.  Her family history is significant for anxiety, with her mother, grandmother, and aunt all affected by the condition.  No issues with sleep, stating that it is the only time she feels a sense of peace. She does not report feeling sad but acknowledges a lack of interest and feeling down, as indicated by a PHQ-9 score of 3. Her anxiety symptoms are more prominent, with a GAD-7 score of 7.       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   Panic disorder 09/05/2020   Tobacco dependence 07/26/2019   Hidradenitis suppurativa 01/21/2019   Keratosis pilaris 02/16/2018   Left ovarian cyst 01/18/2017    Past Surgical History:  Procedure Laterality Date   CESAREAN SECTION  01/18/2023   Procedure: CESAREAN SECTION;  Surgeon: Schermerhorn, Debby PARAS, MD;  Location: [facility] ORS;  Service: Obstetrics;;   TONSILLECTOMY AND ADENOIDECTOMY      Family History  Problem Relation Age of Onset   Obesity Mother    Anxiety disorder Mother    Diabetes Father    Hypertension Father    Depression Father    ADD / ADHD Brother    Rheum arthritis Maternal Grandmother    Arthritis Maternal Grandmother    Breast cancer Neg Hx    Ovarian cancer Neg Hx    Colon cancer Neg Hx     Social History   Tobacco Use   Smoking status: Some Days    Current packs/day: 0.00    Types: E-cigarettes, Cigarettes    Last attempt to quit: 05/25/2022    Years since quitting: 0.9   Smokeless tobacco: Former  Substance Use Topics   Alcohol use: Not Currently    Comment: rare     Current Outpatient Medications:    chlorhexidine (PERIDEX) 0.12 % solution, Use as directed 15 mLs in the  mouth or throat daily., Disp: , Rfl:    Drospirenone  (SLYND ) 4 MG TABS, Take 1 tablet (4 mg total) by mouth daily., Disp: , Rfl:    sertraline  (ZOLOFT ) 25 MG tablet, Take 1 tablet (25 mg total) by mouth daily., Disp: 30 tablet, Rfl: 3   sertraline  (ZOLOFT ) 50 MG tablet, TAKE 1 TABLET BY MOUTH EVERY DAY, Disp: 30 tablet, Rfl: 1  No Known Allergies  I personally reviewed active problem list, medication list, allergies with the patient/caregiver today.   ROS  Ten systems reviewed and is negative except as mentioned in HPI    Objective  Vitals:   05/08/23 0834  BP: 112/70  Pulse: 98  Resp: 16  Temp: 97.7 F (36.5 C)   TempSrc: Oral  SpO2: 99%  Weight: 258 lb (117 kg)  Height: 5' 8 (1.727 m)    Body mass index is 39.23 kg/m.  Physical Exam  Constitutional: Patient appears well-developed and well-nourished. Obese  No distress.  HEENT: head atraumatic, normocephalic, pupils equal and reactive to light, , neck supple Cardiovascular: Normal rate, regular rhythm and normal heart sounds.  No murmur heard. No BLE edema. Pulmonary/Chest: Effort normal and breath sounds normal. No respiratory distress. Skin: healed areas of HS under left breast Abdominal: Soft.  There is no tenderness. Psychiatric: Patient has a normal mood and affect. behavior is normal. Judgment and thought content normal.   Recent Results (from the past 2160 hours)  CBC     Status: Abnormal   Collection Time: 02/26/23  4:13 PM  Result Value Ref Range   WBC 13.0 (H) 3.4 - 10.8 x10E3/uL    Comment: **Effective March 03, 2023 profile [id number] WBC will be made**   non-orderable as a stand-alone order code.    RBC 4.04 3.77 - 5.28 x10E6/uL   Hemoglobin 11.5 11.1 - 15.9 g/dL   Hematocrit 63.8 65.9 - 46.6 %   MCV 89 79 - 97 fL   MCH 28.5 26.6 - 33.0 pg   MCHC 31.9 31.5 - 35.7 g/dL   RDW 87.3 88.2 - 84.5 %   Platelets 381 150 - 450 x10E3/uL  TSH + free T4     Status: None   Collection Time: 04/17/23  3:45 PM  Result Value Ref Range   TSH 1.690 0.450 - 4.500 uIU/mL   Free T4 1.06 0.82 - 1.77 ng/dL  CBC w/Diff/Platelet     Status: Abnormal   Collection Time: 04/17/23  3:45 PM  Result Value Ref Range   WBC 13.1 (H) 3.4 - 10.8 x10E3/uL   RBC 4.22 3.77 - 5.28 x10E6/uL   Hemoglobin 11.3 11.1 - 15.9 g/dL   Hematocrit 64.4 65.9 - 46.6 %   MCV 84 79 - 97 fL   MCH 26.8 26.6 - 33.0 pg   MCHC 31.8 31.5 - 35.7 g/dL   RDW 87.0 88.2 - 84.5 %   Platelets 406 150 - 450 x10E3/uL   Neutrophils 70 Not Estab. %   Lymphs 22 Not Estab. %   Monocytes 5 Not Estab. %   Eos 2 Not Estab. %   Basos 1 Not Estab. %   Neutrophils Absolute 9.1 (H) 1.4  - 7.0 x10E3/uL   Lymphocytes Absolute 2.9 0.7 - 3.1 x10E3/uL   Monocytes Absolute 0.7 0.1 - 0.9 x10E3/uL   EOS (ABSOLUTE) 0.3 0.0 - 0.4 x10E3/uL   Basophils Absolute 0.1 0.0 - 0.2 x10E3/uL   Immature Granulocytes 0 Not Estab. %   Immature Grans (Abs) 0.0 0.0 - 0.1  x10E3/uL    Diabetic Foot Exam:     PHQ2/9:    05/08/2023    8:52 AM 05/01/2023   10:07 AM 07/18/2022    8:55 AM 06/07/2022    8:30 AM 05/06/2022    9:51 AM  Depression screen PHQ 2/9  Decreased Interest 1 1 0 0 0  Down, Depressed, Hopeless 1 1 0 0 0  PHQ - 2 Score 2 2 0 0 0  Altered sleeping 0 0 0  0  Tired, decreased energy 0 0 0  0  Change in appetite 0 0 0  0  Feeling bad or failure about yourself  0 0 0  0  Trouble concentrating 1 1 0  0  Moving slowly or fidgety/restless 0 0 0  0  Suicidal thoughts 0 0 0  0  PHQ-9 Score 3 3 0  0  Difficult doing work/chores Somewhat difficult Somewhat difficult Not difficult at all      phq 9 is positive     05/08/2023    8:53 AM 05/01/2023   10:07 AM 07/18/2022    8:56 AM 05/06/2022    9:51 AM  GAD 7 : Generalized Anxiety Score  Nervous, Anxious, on Edge 1 1 0 0  Control/stop worrying 1 1 0 0  Worry too much - different things 1 1 0 0  Trouble relaxing 1 1 0 0  Restless 1 1 0 0  Easily annoyed or irritable 1 1 0 0  Afraid - awful might happen 1 1 0 0  Total GAD 7 Score 7 7 0 0  Anxiety Difficulty Somewhat difficult Somewhat difficult Not difficult at all       Fall Risk:    05/01/2023   10:07 AM 10/02/2022    9:37 AM 05/06/2022    9:50 AM 03/19/2022    3:34 PM 09/05/2020    9:46 AM  Fall Risk   Falls in the past year? 0 0 0 0 0  Number falls in past yr: 0 0  0 0  Injury with Fall? 0 0  0 0  Risk for fall due to : No Fall Risks No Fall Risks No Fall Risks No Fall Risks   Follow up Falls prevention discussed;Education provided;Falls evaluation completed Falls evaluation completed Falls prevention discussed;Education provided;Falls evaluation completed Falls prevention  discussed;Education provided;Falls evaluation completed Falls evaluation completed     Assessment and Plan    Depression and Anxiety Patient reports persistent anxiety and depressive symptoms despite current treatment with Zoloft  50mg . Family history of anxiety. Symptoms have worsened postpartum. Patient experiences weekly panic attacks characterized by crying, chest tightness, and tension. -Transition from Zoloft  to Duloxetine . First week:Duloxetine  30mg  daily plus Zoloft  50mg  daily. Second week:Duloxetine  60mg  daily (two 30mg  capsules) and Zoloft  25mg  daily. Discontinue Zoloft  after second week. -Add Buspirone  5mg , 1-2 tablets as needed for panic attacks. -Schedule follow-up appointment in 4 weeks to assess response to medication changes.  Hidradenitis Suppurativa Patient reports recurrent lesions under breasts and between thighs. No current treatment. -Advise patient to keep area clean, use warm compresses as needed for pain, and monitor for signs of infection. -Consider dermatology referral if symptoms worsen or fail to improve.  Birth Control Management Patient currently on progesterone-only birth control, managed by Gynecologist. -Plan to take over birth control management after next Gynecology follow-up appointment, pending any changes in current regimen.

## 2023-05-31 DIAGNOSIS — Z419 Encounter for procedure for purposes other than remedying health state, unspecified: Secondary | ICD-10-CM | POA: Diagnosis not present

## 2023-06-04 ENCOUNTER — Other Ambulatory Visit: Payer: Self-pay | Admitting: Family Medicine

## 2023-06-04 DIAGNOSIS — F410 Panic disorder [episodic paroxysmal anxiety] without agoraphobia: Secondary | ICD-10-CM

## 2023-06-04 NOTE — Telephone Encounter (Signed)
 F/u on 06/10/23

## 2023-06-10 ENCOUNTER — Ambulatory Visit: Payer: PRIVATE HEALTH INSURANCE | Admitting: Family Medicine

## 2023-06-19 ENCOUNTER — Ambulatory Visit: Admitting: Family Medicine

## 2023-06-26 ENCOUNTER — Encounter: Payer: Self-pay | Admitting: Family Medicine

## 2023-06-26 ENCOUNTER — Other Ambulatory Visit: Payer: Self-pay | Admitting: Family Medicine

## 2023-06-26 DIAGNOSIS — F419 Anxiety disorder, unspecified: Secondary | ICD-10-CM

## 2023-07-02 ENCOUNTER — Ambulatory Visit: Admitting: Family Medicine

## 2023-07-04 ENCOUNTER — Ambulatory Visit (LOCAL_COMMUNITY_HEALTH_CENTER): Payer: Self-pay

## 2023-07-04 DIAGNOSIS — Z111 Encounter for screening for respiratory tuberculosis: Secondary | ICD-10-CM

## 2023-07-07 ENCOUNTER — Ambulatory Visit (LOCAL_COMMUNITY_HEALTH_CENTER): Payer: Self-pay

## 2023-07-07 DIAGNOSIS — Z111 Encounter for screening for respiratory tuberculosis: Secondary | ICD-10-CM

## 2023-07-07 LAB — TB SKIN TEST
Induration: 0 mm
TB Skin Test: NEGATIVE

## 2023-07-08 ENCOUNTER — Ambulatory Visit: Admitting: Family Medicine

## 2023-07-11 ENCOUNTER — Ambulatory Visit: Admitting: Licensed Practical Nurse

## 2023-07-11 VITALS — BP 124/81 | HR 99 | Ht 69.0 in | Wt 272.2 lb

## 2023-07-11 DIAGNOSIS — Z09 Encounter for follow-up examination after completed treatment for conditions other than malignant neoplasm: Secondary | ICD-10-CM

## 2023-07-11 DIAGNOSIS — Z30018 Encounter for initial prescription of other contraceptives: Secondary | ICD-10-CM

## 2023-07-11 DIAGNOSIS — Z3041 Encounter for surveillance of contraceptive pills: Secondary | ICD-10-CM

## 2023-07-11 MED ORDER — SLYND 4 MG PO TABS: 1.0000 | ORAL_TABLET | Freq: Every day | ORAL | 12 refills | Status: AC

## 2023-07-11 NOTE — Progress Notes (Signed)
 Maureen Cory, MD   Chief Complaint  Patient presents with   Contraception    HPI:      Maureen Gutierrez is a 23 y.o. G1P1001 whose LMP was Patient's last menstrual period was 07/05/2023 (within days)., presents today for follow up.  Yuval was prescribed Slynd for irregular heavy bleeding. Since being on Slynd she has regular cycles, the flow is "normal", not heavy like what brought her in. She would like to continue Slynd  At her last visit her Zoloft was increased to 75mg . Shortly after she saw her PCP and was switched to Cymbalta. She feels much better on Cymbalta.     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   Panic disorder 09/05/2020   Tobacco dependence 07/26/2019   Hidradenitis suppurativa 01/21/2019   Keratosis pilaris 02/16/2018   Left ovarian cyst 01/18/2017    Past Surgical History:  Procedure Laterality Date   CESAREAN SECTION  01/18/2023   Procedure: CESAREAN SECTION;  Surgeon: Schermerhorn, Ihor Austin, MD;  Location: [facility] ORS;  Service: Obstetrics;;   TONSILLECTOMY AND ADENOIDECTOMY      Family History  Problem Relation Age of Onset   Obesity Mother    Anxiety disorder Mother    Diabetes Father    Hypertension Father    Depression Father    ADD / ADHD Brother    Rheum arthritis Maternal Grandmother    Arthritis Maternal Grandmother    Breast cancer Neg Hx    Ovarian cancer Neg Hx    Colon cancer Neg Hx     Social History   Socioeconomic History   Marital status: Significant Other    Spouse name: DOES NOT WANT TO GIVE NAME   Number of children: 0   Years of education: Not on file   Highest education level: GED or equivalent  Occupational History   Occupation: Production assistant, radio    Comment: [employer]   Occupation: Conservation officer, nature    Comment: [employer]   Occupation: Hosts    Comment: [employer]   Occupation:  NOT CURRENTLY EMPLOYEED  Tobacco Use   Smoking status: Some Days    Current packs/day: 0.00    Types: E-cigarettes, Cigarettes    Last attempt to quit: 05/25/2022    Years since quitting: 1.1   Smokeless tobacco: Former  Advertising account planner   Vaping status: Former   Start date: 01/30/2018   Substances: Flavoring  Substance and Sexual Activity   Alcohol use: Not Currently    Comment: rare   Drug use: No   Sexual activity: Yes    Partners: Male    Birth control/protection: Pill, Other-see comments, None    Comment: I will be starting birth control pills when i start my cycle  Other Topics Concern   Not on file  Social History Narrative   Not on file   Social Drivers of Health   Financial Resource Strain: Low Risk  (04/29/2023)   Overall Financial Resource Strain (CARDIA)    Difficulty of Paying Living Expenses: Not hard at all  Food Insecurity: No Food Insecurity (04/29/2023)   Hunger Vital Sign    Worried About Running Out of Food in the Last Year: Never true    Ran Out of Food in the Last Year: Never true  Transportation Needs: No Transportation Needs (04/29/2023)   PRAPARE - Transportation  Lack of Transportation (Medical): No    Lack of Transportation (Non-Medical): No  Physical Activity: Sufficiently Active (04/29/2023)   Exercise Vital Sign    Days of Exercise per Week: 7 days    Minutes of Exercise per Session: 30 min  Stress: No Stress Concern Present (04/29/2023)   [employer] of Occupational Health - Occupational Stress Questionnaire    Feeling of Stress : Not at all  Social Connections: Moderately Integrated (04/29/2023)   Social Connection and Isolation Panel [NHANES]    Frequency of Communication with Friends and Family: More than three times a week    Frequency of Social Gatherings with Friends and Family: More than three times a week    Attends Religious Services: More than 4 times per year    Active Member of [employer] or Organizations: No    Attends Museum/gallery exhibitions officer: Not on file    Marital Status: Living with partner  Intimate Partner Violence: Not At Risk (05/01/2023)   Humiliation, Afraid, Rape, and Kick questionnaire    Fear of Current or Ex-Partner: No    Emotionally Abused: No    Physically Abused: No    Sexually Abused: No    Outpatient Medications Prior to Visit  Medication Sig Dispense Refill   busPIRone (BUSPAR) 5 MG tablet Take 1-2 tablets (5-10 mg total) by mouth 2 (two) times daily as needed. 60 tablet 0   DULoxetine (CYMBALTA) 30 MG capsule TAKE 1-2 CAPSULES (30-60 MG TOTAL) BY MOUTH DAILY. 60 capsule 0   Multiple Vitamin (MULTIVITAMIN WITH MINERALS) TABS tablet Take 1 tablet by mouth daily.     Drospirenone (SLYND) 4 MG TABS Take 1 tablet (4 mg total) by mouth daily.     chlorhexidine (PERIDEX) 0.12 % solution Use as directed 15 mLs in the mouth or throat daily.     No facility-administered medications prior to visit.      ROS:  Review of Systems see HPI    OBJECTIVE:   Vitals:  BP 124/81 (BP Location: Left Arm, Patient Position: Sitting, Cuff Size: Normal)   Pulse 99   Ht 5\' 9"  (1.753 m)   Wt 272 lb 3.2 oz (123.5 kg)   LMP 07/05/2023 (Within Days)   Breastfeeding No   BMI 40.20 kg/m   Physical Exam Constitutional:      Appearance: Normal appearance.  Pulmonary:     Effort: Pulmonary effort is normal.  Musculoskeletal:     Cervical back: Normal range of motion.  Neurological:     Mental Status: She is alert.  Psychiatric:        Mood and Affect: Mood normal.        Thought Content: Thought content normal.    PHQ-9 0 GAD-7 0     Assessment/Plan: Encounter for surveillance of contraceptive pills - Plan: Drospirenone (SLYND) 4 MG TABS  Encounter for initial prescription of other contraceptives    Meds ordered this encounter  Medications   Drospirenone (SLYND) 4 MG TABS    Sig: Take 1 tablet (4 mg total) by mouth daily.    Dispense:  28 tablet    Refill:  12   RTC around  October for Annual exam   Ellouise Newer [facility], CNM 07/11/2023 11:34 AM

## 2023-07-12 DIAGNOSIS — Z419 Encounter for procedure for purposes other than remedying health state, unspecified: Secondary | ICD-10-CM | POA: Diagnosis not present

## 2023-07-22 ENCOUNTER — Telehealth (INDEPENDENT_AMBULATORY_CARE_PROVIDER_SITE_OTHER): Admitting: Family Medicine

## 2023-07-22 ENCOUNTER — Encounter: Payer: Self-pay | Admitting: Family Medicine

## 2023-07-22 DIAGNOSIS — F32A Depression, unspecified: Secondary | ICD-10-CM

## 2023-07-22 DIAGNOSIS — F419 Anxiety disorder, unspecified: Secondary | ICD-10-CM

## 2023-07-22 MED ORDER — DULOXETINE HCL 60 MG PO CPEP: 60.0000 mg | ORAL_CAPSULE | Freq: Every day | ORAL | 1 refills | Status: AC

## 2023-07-22 NOTE — Progress Notes (Signed)
 Name: Maureen Gutierrez   MRN: [id number]    DOB: 08-21-2000   Date:07/22/2023       Progress Note  Subjective  Chief Complaint  Chief Complaint  Patient presents with   Medical Management of Chronic Issues    Doing well on duloxetine      I connected with  Maureen Gutierrez  on 07/22/23 at 10:40 AM EDT by a video enabled telemedicine application and verified that I am speaking with the correct person using two identifiers.  I discussed the limitations of evaluation and management by telemedicine and the availability of in person appointments. The patient expressed understanding and agreed to proceed with the virtual visit  Staff also discussed with the patient that there may be a patient responsible charge related to this service. Patient Location: passenger in a car Provider Location: [facility] Additional Individuals present: grandmother   Discussed the use of AI scribe software for clinical note transcription with the patient, who gave verbal consent to proceed.  History of Present Illness Maureen Gutierrez is a 23 year old female who presents for follow-up regarding anxiety management and birth control prescription.  She has been managing her anxiety with duloxetine , having transitioned from Zoloft , which was ineffective during her pregnancy. She is currently on 60 mg of duloxetine  daily, which effectively controls her anxiety without the need for additional medication like Buspar . No side effects are reported, and her anxiety and depression scores are both zero, indicating good symptom control.  Regarding birth control, she consulted with her OB GYN to determine a suitable method and was provided with a one-year supply of Drosperinone  4 mg tablets, a method she has used previously. . She is satisfied with this method and wishes to continue it under the care of her family physician.    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   Panic disorder 09/05/2020   Tobacco dependence 07/26/2019   Hidradenitis suppurativa 01/21/2019   Keratosis pilaris 02/16/2018   Left ovarian cyst 01/18/2017    Past Surgical History:  Procedure Laterality Date   CESAREAN SECTION  01/18/2023   Procedure: CESAREAN SECTION;  Surgeon: Schermerhorn, Joselyn Nicely, MD;  Location: [facility] ORS;  Service: Obstetrics;;   TONSILLECTOMY AND ADENOIDECTOMY      Family History  Problem Relation Age of Onset   Obesity Mother    Anxiety disorder Mother    Diabetes Father    Hypertension Father    Depression Father    ADD / ADHD Brother    Rheum arthritis Maternal Grandmother    Arthritis Maternal Grandmother    Breast cancer Neg Hx    Ovarian cancer Neg Hx    Colon cancer Neg Hx     Social History   Socioeconomic History   Marital status: Significant Other    Spouse name: DOES NOT WANT TO GIVE NAME   Number of children: 0   Years of education: Not on file   Highest education level: GED or equivalent  Occupational History   Occupation: Production assistant, radio    Comment: [employer]   Occupation: Conservation officer, nature    Comment: [employer]   Occupation: Hosts    Comment: [employer]   Occupation: NOT CURRENTLY EMPLOYEED  Tobacco Use   Smoking status: Some Days    Current packs/day: 0.00    Types: E-cigarettes, Cigarettes    Last  attempt to quit: 05/25/2022    Years since quitting: 1.1   Smokeless tobacco: Former  Building services engineer status: Former   Start date: 01/30/2018   Substances: Flavoring  Substance and Sexual Activity   Alcohol use: Not Currently    Comment: rare   Drug use: No   Sexual activity: Yes    Partners: Male    Birth control/protection: Pill, Other-see comments, None    Comment: I will be starting birth control pills when i start my cycle  Other Topics Concern   Not on file  Social History Narrative   Not on file   Social Drivers of Health    Financial Resource Strain: Low Risk  (04/29/2023)   Overall Financial Resource Strain (CARDIA)    Difficulty of Paying Living Expenses: Not hard at all  Food Insecurity: No Food Insecurity (04/29/2023)   Hunger Vital Sign    Worried About Running Out of Food in the Last Year: Never true    Ran Out of Food in the Last Year: Never true  Transportation Needs: No Transportation Needs (04/29/2023)   PRAPARE - Administrator, Civil Service (Medical): No    Lack of Transportation (Non-Medical): No  Physical Activity: Sufficiently Active (04/29/2023)   Exercise Vital Sign    Days of Exercise per Week: 7 days    Minutes of Exercise per Session: 30 min  Stress: No Stress Concern Present (04/29/2023)   [employer] of Occupational Health - Occupational Stress Questionnaire    Feeling of Stress : Not at all  Social Connections: Moderately Integrated (04/29/2023)   Social Connection and Isolation Panel [NHANES]    Frequency of Communication with Friends and Family: More than three times a week    Frequency of Social Gatherings with Friends and Family: More than three times a week    Attends Religious Services: More than 4 times per year    Active Member of [employer] or Organizations: No    Attends Engineer, structural: Not on file    Marital Status: Living with partner  Intimate Partner Violence: Not At Risk (05/01/2023)   Humiliation, Afraid, Rape, and Kick questionnaire    Fear of Current or Ex-Partner: No    Emotionally Abused: No    Physically Abused: No    Sexually Abused: No     Current Outpatient Medications:    busPIRone  (BUSPAR ) 5 MG tablet, Take 1-2 tablets (5-10 mg total) by mouth 2 (two) times daily as needed., Disp: 60 tablet, Rfl: 0   Drospirenone  (SLYND ) 4 MG TABS, Take 1 tablet (4 mg total) by mouth daily., Disp: 28 tablet, Rfl: 12   DULoxetine  (CYMBALTA ) 30 MG capsule, TAKE 1-2 CAPSULES (30-60 MG TOTAL) BY MOUTH DAILY., Disp: 60 capsule, Rfl: 0   Multiple  Vitamin (MULTIVITAMIN WITH MINERALS) TABS tablet, Take 1 tablet by mouth daily., Disp: , Rfl:   No Known Allergies  I personally reviewed active problem list, medication list, allergies with the patient/caregiver today.   ROS  Ten systems reviewed and is negative except as mentioned in HPI   Objective  Virtual encounter, vitals not obtained.  There is no height or weight on file to calculate BMI.  Physical Exam  Awake, alert and oriented   PHQ2/9:    07/22/2023   10:55 AM 07/11/2023    1:35 PM 05/08/2023    8:52 AM 05/01/2023   10:07 AM 07/18/2022    8:55 AM  Depression screen PHQ 2/9  Decreased Interest  0 0 1 1 0  Down, Depressed, Hopeless 0 0 1 1 0  PHQ - 2 Score 0 0 2 2 0  Altered sleeping 0 0 0 0 0  Tired, decreased energy 0 0 0 0 0  Change in appetite 0 0 0 0 0  Feeling bad or failure about yourself  0 0 0 0 0  Trouble concentrating 0 0 1 1 0  Moving slowly or fidgety/restless 0 0 0 0 0  Suicidal thoughts 0 0 0 0 0  PHQ-9 Score 0 0 3 3 0  Difficult doing work/chores Not difficult at all Not difficult at all Somewhat difficult Somewhat difficult Not difficult at all   PHQ-2/9 Result is negative.       07/22/2023   10:55 AM 07/11/2023    1:35 PM 05/08/2023    8:53 AM 05/01/2023   10:07 AM  GAD 7 : Generalized Anxiety Score  Nervous, Anxious, on Edge 0 0 1 1  Control/stop worrying 0 0 1 1  Worry too much - different things 0 0 1 1  Trouble relaxing 0 0 1 1  Restless 0 0 1 1  Easily annoyed or irritable 0 0 1 1  Afraid - awful might happen 0 0 1 1  Total GAD 7 Score 0 0 7 7  Anxiety Difficulty Not difficult at all Not difficult at all Somewhat difficult Somewhat difficult      Fall Risk:    05/01/2023   10:07 AM 10/02/2022    9:37 AM 05/06/2022    9:50 AM 03/19/2022    3:34 PM 09/05/2020    9:46 AM  Fall Risk   Falls in the past year? 0 0 0 0 0  Number falls in past yr: 0 0  0 0  Injury with Fall? 0 0  0 0  Risk for fall due to : No Fall Risks No Fall Risks  No Fall Risks No Fall Risks   Follow up Falls prevention discussed;Education provided;Falls evaluation completed Falls evaluation completed Falls prevention discussed;Education provided;Falls evaluation completed Falls prevention discussed;Education provided;Falls evaluation completed Falls evaluation completed     Assessment & Plan Anxiety and depression Anxiety well-controlled with duloxetine  60 mg daily. No side effects reported. Anxiety and depression scores zero. - Continue duloxetine  60 mg daily, 90-day supply. - Follow-up in six months.  Contraceptive management Progesterone-only birth control regimen established with no issues reported. - Continue current progesterone-only birth control regimen. - Follow-up in six months.   I discussed the assessment and treatment plan with the patient. The patient was provided an opportunity to ask questions and all were answered. The patient agreed with the plan and demonstrated an understanding of the instructions.  The patient was advised to call back or seek an in-person evaluation if the symptoms worsen or if the condition fails to improve as anticipated.  I provided 15  minutes of non-face-to-face time during this encounter.

## 2023-07-30 ENCOUNTER — Telehealth: Admitting: Family Medicine

## 2023-08-11 DIAGNOSIS — Z419 Encounter for procedure for purposes other than remedying health state, unspecified: Secondary | ICD-10-CM | POA: Diagnosis not present

## 2023-08-18 ENCOUNTER — Encounter: Payer: Self-pay | Admitting: Family Medicine

## 2023-08-29 ENCOUNTER — Ambulatory Visit: Admitting: Family Medicine

## 2023-09-11 DIAGNOSIS — Z419 Encounter for procedure for purposes other than remedying health state, unspecified: Secondary | ICD-10-CM | POA: Diagnosis not present

## 2023-09-17 ENCOUNTER — Ambulatory Visit: Admitting: Family Medicine

## 2023-10-11 DIAGNOSIS — Z419 Encounter for procedure for purposes other than remedying health state, unspecified: Secondary | ICD-10-CM | POA: Diagnosis not present

## 2023-11-11 DIAGNOSIS — Z419 Encounter for procedure for purposes other than remedying health state, unspecified: Secondary | ICD-10-CM | POA: Diagnosis not present

## 2023-11-19 ENCOUNTER — Other Ambulatory Visit: Payer: Self-pay | Admitting: Family Medicine

## 2023-11-19 ENCOUNTER — Encounter: Payer: Self-pay | Admitting: Family Medicine

## 2023-11-19 DIAGNOSIS — F410 Panic disorder [episodic paroxysmal anxiety] without agoraphobia: Secondary | ICD-10-CM

## 2023-11-19 MED ORDER — BUSPIRONE HCL 5 MG PO TABS: 5.0000 mg | ORAL_TABLET | Freq: Two times a day (BID) | ORAL | 0 refills | Status: AC | PRN

## 2023-11-19 NOTE — Telephone Encounter (Signed)
 Tried to contact pt but they said it was the wrong number. The person verified the number as well

## 2023-12-12 DIAGNOSIS — Z419 Encounter for procedure for purposes other than remedying health state, unspecified: Secondary | ICD-10-CM | POA: Diagnosis not present

## 2024-01-05 ENCOUNTER — Telehealth: Admitting: Family Medicine

## 2024-01-06 NOTE — Progress Notes (Signed)
 No show

## 2024-02-11 DIAGNOSIS — Z419 Encounter for procedure for purposes other than remedying health state, unspecified: Secondary | ICD-10-CM | POA: Diagnosis not present
